# Patient Record
Sex: Female | Born: 1935 | Race: Black or African American | Hispanic: No | Smoking: Never smoker
Health system: Southern US, Community
[De-identification: ages and names within clinical notes are randomized; demographics above are authoritative.]

## PROBLEM LIST (undated history)

## (undated) DIAGNOSIS — N183 Chronic kidney disease, stage 3 unspecified: Secondary | ICD-10-CM

## (undated) DIAGNOSIS — E119 Type 2 diabetes mellitus without complications: Secondary | ICD-10-CM

## (undated) DIAGNOSIS — E1122 Type 2 diabetes mellitus with diabetic chronic kidney disease: Secondary | ICD-10-CM

## (undated) DIAGNOSIS — E7800 Pure hypercholesterolemia, unspecified: Secondary | ICD-10-CM

## (undated) DIAGNOSIS — I10 Essential (primary) hypertension: Secondary | ICD-10-CM

---

## 2003-12-28 ENCOUNTER — Ambulatory Visit: Payer: Self-pay | Admitting: Internal Medicine

## 2005-01-31 ENCOUNTER — Ambulatory Visit: Payer: Self-pay | Admitting: Internal Medicine

## 2005-03-09 ENCOUNTER — Ambulatory Visit: Payer: Self-pay | Admitting: Gastroenterology

## 2006-05-31 ENCOUNTER — Ambulatory Visit: Payer: Self-pay | Admitting: Internal Medicine

## 2007-06-03 ENCOUNTER — Ambulatory Visit: Payer: Self-pay | Admitting: Internal Medicine

## 2008-06-16 ENCOUNTER — Ambulatory Visit: Payer: Self-pay | Admitting: Internal Medicine

## 2008-08-13 ENCOUNTER — Ambulatory Visit: Payer: Self-pay | Admitting: Gastroenterology

## 2009-06-21 ENCOUNTER — Ambulatory Visit: Payer: Self-pay | Admitting: Internal Medicine

## 2013-11-16 DIAGNOSIS — E1122 Type 2 diabetes mellitus with diabetic chronic kidney disease: Secondary | ICD-10-CM | POA: Insufficient documentation

## 2013-11-16 DIAGNOSIS — M810 Age-related osteoporosis without current pathological fracture: Secondary | ICD-10-CM | POA: Insufficient documentation

## 2013-12-03 DIAGNOSIS — E7800 Pure hypercholesterolemia, unspecified: Secondary | ICD-10-CM | POA: Insufficient documentation

## 2014-07-05 ENCOUNTER — Emergency Department: Payer: Commercial Managed Care - HMO

## 2014-07-05 ENCOUNTER — Encounter: Payer: Self-pay | Admitting: Emergency Medicine

## 2014-07-05 ENCOUNTER — Emergency Department
Admission: EM | Admit: 2014-07-05 | Discharge: 2014-07-05 | Disposition: A | Discharge status: Home / Self Care | Payer: Commercial Managed Care - HMO | Attending: Emergency Medicine | Admitting: Emergency Medicine

## 2014-07-05 DIAGNOSIS — R52 Pain, unspecified: Secondary | ICD-10-CM

## 2014-07-05 DIAGNOSIS — E119 Type 2 diabetes mellitus without complications: Secondary | ICD-10-CM | POA: Diagnosis not present

## 2014-07-05 DIAGNOSIS — I10 Essential (primary) hypertension: Secondary | ICD-10-CM | POA: Insufficient documentation

## 2014-07-05 DIAGNOSIS — M79662 Pain in left lower leg: Secondary | ICD-10-CM | POA: Diagnosis not present

## 2014-07-05 HISTORY — DX: Chronic kidney disease, stage 3 (moderate): N18.3

## 2014-07-05 HISTORY — DX: Chronic kidney disease, stage 3 unspecified: N18.30

## 2014-07-05 HISTORY — DX: Pure hypercholesterolemia, unspecified: E78.00

## 2014-07-05 HISTORY — DX: Type 2 diabetes mellitus without complications: E11.9

## 2014-07-05 HISTORY — DX: Essential (primary) hypertension: I10

## 2014-07-05 HISTORY — DX: Type 2 diabetes mellitus with diabetic chronic kidney disease: E11.22

## 2014-07-05 MED ORDER — IBUPROFEN 400 MG PO TABS
400.0000 mg | ORAL_TABLET | Freq: Once | ORAL | Status: AC
  Administered 2014-07-05: 400 mg via ORAL

## 2014-07-05 MED ORDER — IBUPROFEN 400 MG PO TABS
ORAL_TABLET | ORAL | Status: AC
  Administered 2014-07-05: 400 mg via ORAL
  Filled 2014-07-05: qty 1

## 2014-07-05 NOTE — ED Provider Notes (Signed)
[facility] Emergency Department Provider Note    ____________________________________________  Time seen: 1040 I have reviewed the triage vital signs and the nursing notes.   HISTORY  Chief Complaint Leg Pain   History limited by: Not Limited   HPI Sonya Craig is a 81 y.o. female who presents to the emergency department because of left lower leg pain. She states the pain started 2 days ago and has gradually gotten worse. The pain is located in the lateral proximal aspect of her lower leg. She denies any trauma. She states she has been able to walk on it albeit with some pain. Denies any fevers or vomiting.     Past Medical History  Diagnosis Date  . Diabetes mellitus without complication   . Hypertension   . Hypercholesterolemia   . Stage 3 chronic kidney disease due to type 2 diabetes mellitus     There are no active problems to display for this patient.   History reviewed. No pertinent past surgical history.  No current outpatient prescriptions on file.  Allergies Review of patient's allergies indicates not on file.  History reviewed. No pertinent family history.  Social History History  Substance Use Topics  . Smoking status: Never Smoker   . Smokeless tobacco: Not on file  . Alcohol Use: Yes     Comment: occasionally    Review of Systems  Constitutional: Negative for fever. Cardiovascular: Negative for chest pain. Respiratory: Negative for shortness of breath. Gastrointestinal: Negative for abdominal pain, vomiting and diarrhea. Genitourinary: Negative for dysuria. Musculoskeletal: Negative for back pain. Left lower leg pain Skin: Negative for rash. Neurological: Negative for headaches, focal weakness or numbness.   10-point ROS otherwise negative.  ____________________________________________   PHYSICAL EXAM:  VITAL SIGNS: ED Triage Vitals  Enc Vitals Group     BP 07/05/14 1028 140/68 mmHg     Pulse Rate  07/05/14 1028 80     Resp --      Temp 07/05/14 1028 97.8 F (36.6 C)     Temp Source 07/05/14 1028 Oral     SpO2 07/05/14 1028 100 %     Weight 07/05/14 1028 147 lb (66.679 kg)     Height 07/05/14 1028 5\' 4"  (1.626 m)     Head Cir --      Peak Flow --      Pain Score 07/05/14 1031 8   Constitutional: Alert and oriented. Well appearing and in no distress. Eyes: Conjunctivae are normal. PERRL. Normal extraocular movements. ENT   Head: Normocephalic and atraumatic.   Nose: No congestion/rhinnorhea.   Mouth/Throat: Mucous membranes are moist.   Neck: No stridor. Hematological/Lymphatic/Immunilogical: No cervical lymphadenopathy. Cardiovascular: Normal rate, regular rhythm.  No murmurs, rubs, or gallops. Respiratory: Normal respiratory effort without tachypnea nor retractions. Breath sounds are clear and equal bilaterally. No wheezes/rales/rhonchi. Gastrointestinal: Soft and nontender. No distention.  Genitourinary: Deferred Musculoskeletal: Normal range of motion in all extremities. No joint effusions.  Mild tenderness to palpation of the left lateral lower leg. No obvious swelling. No discoloration, or ecchymosis. Neurovascularly intact distally. Neurologic:  Normal speech and language. No gross focal neurologic deficits are appreciated. Speech is normal.  Skin:  Skin is warm, dry and intact. No rash noted. Psychiatric: Mood and affect are normal. Speech and behavior are normal. Patient exhibits appropriate insight and judgment.  ____________________________________________    LABS (pertinent positives/negatives)  None  ____________________________________________   EKG  None  ____________________________________________    RADIOLOGY  Tib/fib x-ray IMPRESSION:  No acute abnormalities.  Knee degenerative changes.  ____________________________________________   PROCEDURES  Procedure(s) performed: None  Critical Care performed:  No  ____________________________________________   INITIAL IMPRESSION / ASSESSMENT AND PLAN / ED COURSE  Pertinent labs & imaging results that were available during my care of the patient were reviewed by me and considered in my medical decision making (see chart for details).  Patient here with the lateral left lower leg pain physical exam without any concerning findings. Will obtain x-rays to evaluate for osseous injury.  ----------------------------------------- 11:57 AM on 07/05/2014 -----------------------------------------  No acute findings found on x-ray. Patient  does state she feels better after medication. We will discharge home  ____________________________________________   FINAL CLINICAL IMPRESSION(S) / ED DIAGNOSES  Final diagnoses:  Pain     Nance Pear, MD 07/05/14 1158

## 2014-07-05 NOTE — Discharge Instructions (Signed)
Please seek medical attention for any high fevers, chest pain, shortness of breath, change in behavior, persistent vomiting, bloody stool or any other new or concerning symptoms. Arthritis, Nonspecific Arthritis is inflammation of a joint. This usually means pain, redness, warmth or swelling are present. One or more joints may be involved. There are a number of types of arthritis. Your caregiver may not be able to tell what type of arthritis you have right away. CAUSES  The most common cause of arthritis is the wear and tear on the joint (osteoarthritis). This causes damage to the cartilage, which can break down over time. The knees, hips, back and neck are most often affected by this type of arthritis. Other types of arthritis and common causes of joint pain include:  Sprains and other injuries near the joint. Sometimes minor sprains and injuries cause pain and swelling that develop hours later.  Rheumatoid arthritis. This affects hands, feet and knees. It usually affects both sides of your body at the same time. It is often associated with chronic ailments, fever, weight loss and general weakness.  Crystal arthritis. Gout and pseudo gout can cause occasional acute severe pain, redness and swelling in the foot, ankle, or knee.  Infectious arthritis. Bacteria can get into a joint through a break in overlying skin. This can cause infection of the joint. Bacteria and viruses can also spread through the blood and affect your joints.  Drug, infectious and allergy reactions. Sometimes joints can become mildly painful and slightly swollen with these types of illnesses. SYMPTOMS   Pain is the main symptom.  Your joint or joints can also be red, swollen and warm or hot to the touch.  You may have a fever with certain types of arthritis, or even feel overall ill.  The joint with arthritis will hurt with movement. Stiffness is present with some types of arthritis. DIAGNOSIS  Your caregiver will  suspect arthritis based on your description of your symptoms and on your exam. Testing may be needed to find the type of arthritis:  Blood and sometimes urine tests.  X-ray tests and sometimes CT or MRI scans.  Removal of fluid from the joint (arthrocentesis) is done to check for bacteria, crystals or other causes. Your caregiver (or a specialist) will numb the area over the joint with a local anesthetic, and use a needle to remove joint fluid for examination. This procedure is only minimally uncomfortable.  Even with these tests, your caregiver may not be able to tell what kind of arthritis you have. Consultation with a specialist (rheumatologist) may be helpful. TREATMENT  Your caregiver will discuss with you treatment specific to your type of arthritis. If the specific type cannot be determined, then the following general recommendations may apply. Treatment of severe joint pain includes:  Rest.  Elevation.  Anti-inflammatory medication (for example, ibuprofen) may be prescribed. Avoiding activities that cause increased pain.  Only take over-the-counter or prescription medicines for pain and discomfort as recommended by your caregiver.  Cold packs over an inflamed joint may be used for 10 to 15 minutes every hour. Hot packs sometimes feel better, but do not use overnight. Do not use hot packs if you are diabetic without your caregiver's permission.  A cortisone shot into arthritic joints may help reduce pain and swelling.  Any acute arthritis that gets worse over the next 1 to 2 days needs to be looked at to be sure there is no joint infection. Long-term arthritis treatment involves modifying activities and lifestyle  to reduce joint stress jarring. This can include weight loss. Also, exercise is needed to nourish the joint cartilage and remove waste. This helps keep the muscles around the joint strong. HOME CARE INSTRUCTIONS   Do not take aspirin to relieve pain if gout is suspected.  This elevates uric acid levels.  Only take over-the-counter or prescription medicines for pain, discomfort or fever as directed by your caregiver.  Rest the joint as much as possible.  If your joint is swollen, keep it elevated.  Use crutches if the painful joint is in your leg.  Drinking plenty of fluids may help for certain types of arthritis.  Follow your caregiver's dietary instructions.  Try low-impact exercise such as:  Swimming.  Water aerobics.  Biking.  Walking.  Morning stiffness is often relieved by a warm shower.  Put your joints through regular range-of-motion. SEEK MEDICAL CARE IF:   You do not feel better in 24 hours or are getting worse.  You have side effects to medications, or are not getting better with treatment. SEEK IMMEDIATE MEDICAL CARE IF:   You have a fever.  You develop severe joint pain, swelling or redness.  Many joints are involved and become painful and swollen.  There is severe back pain and/or leg weakness.  You have loss of bowel or bladder control. Document Released: 03/16/2004 Document Revised: 05/01/2011 Document Reviewed: 04/01/2008 [facility] Patient Information 2015 [place], [place]. This information is not intended to replace advice given to you by your health care provider. Make sure you discuss any questions you have with your health care provider.

## 2014-07-05 NOTE — ED Notes (Signed)
Patient arrival to [facility] ED with c/o left leg pain. Located lateral knee and donw mid shaft. Patient able to ambulate, with pain. Since friday

## 2018-04-21 ENCOUNTER — Emergency Department
Admission: EM | Admit: 2018-04-21 | Discharge: 2018-04-21 | Disposition: A | Discharge status: Home / Self Care | Payer: Medicare Other | Attending: Emergency Medicine | Admitting: Emergency Medicine

## 2018-04-21 ENCOUNTER — Other Ambulatory Visit: Payer: Self-pay

## 2018-04-21 DIAGNOSIS — I129 Hypertensive chronic kidney disease with stage 1 through stage 4 chronic kidney disease, or unspecified chronic kidney disease: Secondary | ICD-10-CM | POA: Diagnosis not present

## 2018-04-21 DIAGNOSIS — E1122 Type 2 diabetes mellitus with diabetic chronic kidney disease: Secondary | ICD-10-CM | POA: Diagnosis not present

## 2018-04-21 DIAGNOSIS — R55 Syncope and collapse: Secondary | ICD-10-CM | POA: Diagnosis not present

## 2018-04-21 DIAGNOSIS — N183 Chronic kidney disease, stage 3 (moderate): Secondary | ICD-10-CM | POA: Diagnosis not present

## 2018-04-21 DIAGNOSIS — R42 Dizziness and giddiness: Secondary | ICD-10-CM | POA: Insufficient documentation

## 2018-04-21 LAB — BASIC METABOLIC PANEL
Anion gap: 10 (ref 5–15)
BUN: 36 mg/dL — ABNORMAL HIGH (ref 8–23)
CALCIUM: 9.3 mg/dL (ref 8.9–10.3)
CO2: 29 mmol/L (ref 22–32)
CREATININE: 1.73 mg/dL — AB (ref 0.44–1.00)
Chloride: 102 mmol/L (ref 98–111)
GFR calc non Af Amer: 26 mL/min — ABNORMAL LOW (ref >60)
GFR, EST AFRICAN AMERICAN: 30 mL/min — AB (ref >60)
GLUCOSE: 149 mg/dL — AB (ref 70–99)
Potassium: 3.5 mmol/L (ref 3.5–5.1)
Sodium: 141 mmol/L (ref 135–145)

## 2018-04-21 LAB — CBC
HCT: 34 % — ABNORMAL LOW (ref 36.0–46.0)
Hemoglobin: 10.9 g/dL — ABNORMAL LOW (ref 12.0–15.0)
MCH: 30.3 pg (ref 26.0–34.0)
MCHC: 32.1 g/dL (ref 30.0–36.0)
MCV: 94.4 fL (ref 80.0–100.0)
NRBC: 0 % (ref 0.0–0.2)
PLATELETS: 171 10*3/uL (ref 150–400)
RBC: 3.6 MIL/uL — ABNORMAL LOW (ref 3.87–5.11)
RDW: 15.1 % (ref 11.5–15.5)
WBC: 4.9 10*3/uL (ref 4.0–10.5)

## 2018-04-21 LAB — URINALYSIS, COMPLETE (UACMP) WITH MICROSCOPIC
Bacteria, UA: NONE SEEN
Bilirubin Urine: NEGATIVE
Glucose, UA: NEGATIVE mg/dL
Hgb urine dipstick: NEGATIVE
Ketones, ur: NEGATIVE mg/dL
Nitrite: NEGATIVE
Protein, ur: NEGATIVE mg/dL
SPECIFIC GRAVITY, URINE: 1.013 (ref 1.005–1.030)
pH: 6 (ref 5.0–8.0)

## 2018-04-21 LAB — TROPONIN I

## 2018-04-21 LAB — GLUCOSE, CAPILLARY: Glucose-Capillary: 135 mg/dL — ABNORMAL HIGH (ref 70–99)

## 2018-04-21 MED ORDER — SODIUM CHLORIDE 0.9% FLUSH: 3.0000 mL | Freq: Once | INTRAVENOUS | Status: DC

## 2018-04-21 MED ORDER — SODIUM CHLORIDE 0.9 % IV BOLUS
1000.0000 mL | Freq: Once | INTRAVENOUS | Status: AC
  Administered 2018-04-21: 1000 mL via INTRAVENOUS

## 2018-04-21 NOTE — ED Provider Notes (Signed)
[facility] Emergency Department Provider Note  Time seen: 5:07 PM  I have reviewed the triage vital signs and the nursing notes.   HISTORY  Chief Complaint Loss of Consciousness    HPI Sonya Craig is a 85 y.o. female with a past medical history of diabetes, hypertension, hyperlipidemia, CKD, presents to the emergency department after a syncopal event.  According to the daughter patient was at a table when she was hungry getting ready to eat when she began feeling very lightheaded and said she felt like she was going to pass out.  Patient had a brief syncopal episode and then awoke.  Patient states she thinks is because her blood sugar dropped so she drink some orange juice and came to the emergency department.  Upon arrival patient's blood glucose is 135.  Patient denies any chest pain or shortness of breath.  Daughter states the patient was mildly diaphoretic prior to her syncopal episode.   Past Medical History:  Diagnosis Date  . Diabetes mellitus without complication ([place])   . Hypercholesterolemia   . Hypertension   . Stage 3 chronic kidney disease due to type 2 diabetes mellitus ([place])     There are no active problems to display for this patient.   History reviewed. No pertinent surgical history.  Prior to Admission medications   Not on File    No Known Allergies  No family history on file.  Social History Social History   Tobacco Use  . Smoking status: Never Smoker  . Smokeless tobacco: Never Used  Substance Use Topics  . Alcohol use: Yes    Comment: occasionally  . Drug use: Never    Review of Systems Constitutional: Negative for fever. Cardiovascular: Negative for chest pain. Respiratory: Negative for shortness of breath. Gastrointestinal: Negative for abdominal pain Musculoskeletal: Negative for musculoskeletal complaints Skin: Negative for skin complaints  Neurological: Negative for headache All other ROS  negative  ____________________________________________   PHYSICAL EXAM:  VITAL SIGNS: ED Triage Vitals  Enc Vitals Group     BP 04/21/18 1432 (!) 133/57     Pulse Rate 04/21/18 1432 75     Resp 04/21/18 1432 16     Temp 04/21/18 1432 98.2 F (36.8 C)     Temp Source 04/21/18 1432 Oral     SpO2 04/21/18 1432 100 %     Weight 04/21/18 1433 118 lb (53.5 kg)     Height 04/21/18 1433 5\' 4"  (1.626 m)     Head Circumference --      Peak Flow --      Pain Score 04/21/18 1432 0     Pain Loc --      Pain Edu? --      Excl. in [place]? --    Constitutional: Alert and oriented. Well appearing and in no distress. Eyes: Normal exam ENT   Head: Normocephalic and atraumatic.   Mouth/Throat: Mucous membranes are moist. Cardiovascular: Normal rate, regular rhythm.  Respiratory: Normal respiratory effort without tachypnea nor retractions. Breath sounds are clear Gastrointestinal: Soft and nontender. No distention.  Musculoskeletal: Nontender with normal range of motion in all extremities.  Neurologic:  Normal speech and language. No gross focal neurologic deficits Skin:  Skin is warm, dry and intact.  Psychiatric: Mood and affect are normal.   ____________________________________________    EKG  EKG viewed and interpreted by myself shows a normal sinus rhythm at 80 bpm with a narrow QRS, normal axis, normal intervals, no concerning ST changes.  ____________________________________________   INITIAL IMPRESSION / ASSESSMENT AND PLAN / ED COURSE  Pertinent labs & imaging results that were available during my care of the patient were reviewed by me and considered in my medical decision making (see chart for details).  Patient presents to the emergency department after a brief syncopal episode.  Differential would include orthostatic syncope, dehydration, arrhythmia, hypoglycemia, anemia.  We will check labs, IV hydrate and continue to closely monitor.  Patient's lab work shows mild  renal insufficiency with a creatinine of 1.73 largely unchanged from baseline creatinine per care everywhere in fact somewhat improved.  Anion gap is 10 consistent with possible mild dehydration we will IV hydrate.  Patient's hemoglobin is 10.9, no old hemoglobins for comparison although I would not expect syncope at this level and the patient denies any black or bloody stool.  Urinalysis is normal.  Patient states she has passed out in the past but has been quite some time.  Currently the patient appears very well has no complaints at this time.  I have added on a troponin to the patient's initial blood work and we will recheck a troponin.  If the patient's troponins are negative and the patient continues to appear well I believe the patient would be safe for discharge home with cardiology follow-up for a Holter monitor.  Patient's troponins are negative.  Patient continues to appear extremely well, has been IV hydrated.  Patient is comfortable going home with cardiology follow-up for consideration of Holter monitor.  I discussed very strict return precautions with the patient and family and they are agreeable.  ____________________________________________   FINAL CLINICAL IMPRESSION(S) / ED DIAGNOSES  Syncope    Harvest Dark, MD 04/21/18 1824

## 2018-04-21 NOTE — ED Notes (Signed)
MD paduchowski at bedside to update patient.

## 2018-04-21 NOTE — ED Triage Notes (Signed)
Pt is here with her daughter that she lives with, states she was sitting down getting ready to eat breakfast when she suddenly felt like she going to pass out, daughter states she did pass out for <62min and she eased her to the floor, she did not fall or get injured. Pt states she thinks her sugar dropped  States she did become diaphoretic.pt is a/ox4 on arrival stated she did drink orange juice since incident.

## 2018-04-21 NOTE — Discharge Instructions (Addendum)
Please call the number provided for cardiology tomorrow morning to arrange a follow-up appointment as soon as possible for evaluation and consideration of a Holter monitor given your recent syncopal event.  Please return to the emergency department for any lightheadedness, any feeling like you might pass out, any chest pain, or any other symptom personally concerning to yourself.

## 2018-05-20 ENCOUNTER — Telehealth: Payer: Self-pay | Admitting: *Deleted

## 2018-05-20 NOTE — Telephone Encounter (Signed)
Spoke with the patient and daughter. They would like to cancel the appointment with Dr. Fletcher Anon stating that thye did not feel it was needed. They did not know that they had the appointment.

## 2018-05-21 ENCOUNTER — Ambulatory Visit: Payer: Medicare Other | Admitting: Cardiovascular Disease

## 2018-09-30 DIAGNOSIS — I779 Disorder of arteries and arterioles, unspecified: Secondary | ICD-10-CM | POA: Insufficient documentation

## 2019-02-03 ENCOUNTER — Other Ambulatory Visit: Payer: Self-pay

## 2019-02-03 DIAGNOSIS — Z20822 Contact with and (suspected) exposure to covid-19: Secondary | ICD-10-CM

## 2019-02-04 LAB — NOVEL CORONAVIRUS, NAA: SARS-CoV-2, NAA: NOT DETECTED

## 2019-02-05 ENCOUNTER — Telehealth: Payer: Self-pay | Admitting: *Deleted

## 2019-02-05 NOTE — Telephone Encounter (Signed)
Patient given negative covid results . 

## 2019-08-25 ENCOUNTER — Encounter: Payer: Self-pay | Admitting: Emergency Medicine

## 2019-08-25 ENCOUNTER — Other Ambulatory Visit: Payer: Self-pay

## 2019-08-25 ENCOUNTER — Emergency Department: Payer: Medicare Other

## 2019-08-25 ENCOUNTER — Emergency Department
Admission: EM | Admit: 2019-08-25 | Discharge: 2019-08-25 | Disposition: A | Discharge status: Home / Self Care | Payer: Medicare Other | Attending: Emergency Medicine | Admitting: Emergency Medicine

## 2019-08-25 DIAGNOSIS — R42 Dizziness and giddiness: Secondary | ICD-10-CM | POA: Insufficient documentation

## 2019-08-25 DIAGNOSIS — E860 Dehydration: Secondary | ICD-10-CM

## 2019-08-25 DIAGNOSIS — E1122 Type 2 diabetes mellitus with diabetic chronic kidney disease: Secondary | ICD-10-CM | POA: Insufficient documentation

## 2019-08-25 DIAGNOSIS — N183 Chronic kidney disease, stage 3 unspecified: Secondary | ICD-10-CM | POA: Diagnosis not present

## 2019-08-25 DIAGNOSIS — Z79899 Other long term (current) drug therapy: Secondary | ICD-10-CM | POA: Insufficient documentation

## 2019-08-25 DIAGNOSIS — I129 Hypertensive chronic kidney disease with stage 1 through stage 4 chronic kidney disease, or unspecified chronic kidney disease: Secondary | ICD-10-CM | POA: Insufficient documentation

## 2019-08-25 DIAGNOSIS — Z7982 Long term (current) use of aspirin: Secondary | ICD-10-CM | POA: Insufficient documentation

## 2019-08-25 LAB — URINALYSIS, COMPLETE (UACMP) WITH MICROSCOPIC
Bacteria, UA: NONE SEEN
Bilirubin Urine: NEGATIVE
Glucose, UA: NEGATIVE mg/dL
Hgb urine dipstick: NEGATIVE
Ketones, ur: NEGATIVE mg/dL
Leukocytes,Ua: NEGATIVE
Nitrite: NEGATIVE
Protein, ur: NEGATIVE mg/dL
Specific Gravity, Urine: 1.005 (ref 1.005–1.030)
pH: 8 (ref 5.0–8.0)

## 2019-08-25 LAB — TROPONIN I (HIGH SENSITIVITY)
Troponin I (High Sensitivity): 19 ng/L — ABNORMAL HIGH (ref <18)
Troponin I (High Sensitivity): 17 ng/L (ref <18)

## 2019-08-25 LAB — CBC
HCT: 33.2 % — ABNORMAL LOW (ref 36.0–46.0)
Hemoglobin: 11 g/dL — ABNORMAL LOW (ref 12.0–15.0)
MCH: 30.1 pg (ref 26.0–34.0)
MCHC: 33.1 g/dL (ref 30.0–36.0)
MCV: 91 fL (ref 80.0–100.0)
Platelets: 205 10*3/uL (ref 150–400)
RBC: 3.65 MIL/uL — ABNORMAL LOW (ref 3.87–5.11)
RDW: 14 % (ref 11.5–15.5)
WBC: 3.9 10*3/uL — ABNORMAL LOW (ref 4.0–10.5)
nRBC: 0 % (ref 0.0–0.2)

## 2019-08-25 LAB — COMPREHENSIVE METABOLIC PANEL
ALT: 18 U/L (ref 0–44)
AST: 30 U/L (ref 15–41)
Albumin: 3.7 g/dL (ref 3.5–5.0)
Alkaline Phosphatase: 49 U/L (ref 38–126)
Anion gap: 10 (ref 5–15)
BUN: 43 mg/dL — ABNORMAL HIGH (ref 8–23)
CO2: 30 mmol/L (ref 22–32)
Calcium: 9.4 mg/dL (ref 8.9–10.3)
Chloride: 98 mmol/L (ref 98–111)
Creatinine, Ser: 1.64 mg/dL — ABNORMAL HIGH (ref 0.44–1.00)
GFR calc Af Amer: 32 mL/min — ABNORMAL LOW (ref >60)
GFR calc non Af Amer: 27 mL/min — ABNORMAL LOW (ref >60)
Glucose, Bld: 116 mg/dL — ABNORMAL HIGH (ref 70–99)
Potassium: 3.9 mmol/L (ref 3.5–5.1)
Sodium: 138 mmol/L (ref 135–145)
Total Bilirubin: 0.6 mg/dL (ref 0.3–1.2)
Total Protein: 6.9 g/dL (ref 6.5–8.1)

## 2019-08-25 MED ORDER — SODIUM CHLORIDE 0.9 % IV BOLUS
1000.0000 mL | Freq: Once | INTRAVENOUS | Status: AC
  Administered 2019-08-25: 1000 mL via INTRAVENOUS

## 2019-08-25 NOTE — ED Provider Notes (Signed)
[facility] Emergency Department Provider Note  Time seen: 6:40 PM  I have reviewed the triage vital signs and the nursing notes.   HISTORY  Chief Complaint Dizziness   HPI Sonya Craig is a 86 y.o. female with a past medical history of diabetes, hypertension, hyperlipidemia, CKD, presents to the emergency department for dizziness.  According to the patient report patient was feeling dizzy earlier especially upon standing.  Per EMS patient had near syncope upon standing.  Here patient appears well.  She has been in the waiting room for quite some time however during my evaluation patient denies any symptoms.  States she no longer feels dizzy.  Patient believes her symptoms originated from her not eating this morning.  Denies any chest pain at any point.  Denies any shortness of breath.  Denies any nausea or vomiting.  Denies abdominal pain.  Largely negative review of systems.   Past Medical History:  Diagnosis Date  . Diabetes mellitus without complication ([place])   . Hypercholesterolemia   . Hypertension   . Stage 3 chronic kidney disease due to type 2 diabetes mellitus ([place])     There are no problems to display for this patient.   History reviewed. No pertinent surgical history.  Prior to Admission medications   Medication Sig Start Date End Date Taking? Authorizing Provider  amitriptyline (ELAVIL) 10 MG tablet Take 10 mg by mouth at bedtime. 06/30/19   [provider]  aspirin 81 MG EC tablet Take 81 mg by mouth daily at 6 (six) AM.    [provider]  lisinopril-hydrochlorothiazide (ZESTORETIC) 20-12.5 MG tablet Take 1 tablet by mouth 2 (two) times daily. 07/28/19   [provider]  lovastatin (MEVACOR) 40 MG tablet Take 40 mg by mouth daily. 06/06/19   [provider]  mirtazapine (REMERON) 7.5 MG tablet Take 7.5 mg by mouth at bedtime. 06/06/19   [provider]    No Known Allergies  History reviewed. No  pertinent family history.  Social History Social History   Tobacco Use  . Smoking status: Never Smoker  . Smokeless tobacco: Never Used  Substance Use Topics  . Alcohol use: Yes    Comment: occasionally  . Drug use: Never    Review of Systems Constitutional: Negative for fever.  Positive for dizziness upon standing, now resolved Cardiovascular: Negative for chest pain. Respiratory: Negative for shortness of breath. Gastrointestinal: Negative for abdominal pain, vomiting Genitourinary: Negative for urinary compaints Musculoskeletal: Negative for musculoskeletal complaints Neurological: Negative for headache All other ROS negative  ____________________________________________   PHYSICAL EXAM:  VITAL SIGNS: ED Triage Vitals  Enc Vitals Group     BP 08/25/19 1408 (!) 153/62     Pulse Rate 08/25/19 1408 63     Resp 08/25/19 1408 16     Temp 08/25/19 1408 97.7 F (36.5 C)     Temp Source 08/25/19 1408 Oral     SpO2 08/25/19 1320 99 %     Weight 08/25/19 1409 110 lb (49.9 kg)     Height 08/25/19 1409 5\' 4"  (1.626 m)     Head Circumference --      Peak Flow --      Pain Score 08/25/19 1409 0     Pain Loc --      Pain Edu? --      Excl. in [place]? --     Constitutional: Alert and oriented. Well appearing and in no distress. Eyes: Normal exam ENT  Head: Normocephalic and atraumatic.      Mouth/Throat: Mucous membranes are moist. Cardiovascular: Normal rate, regular rhythm.  Respiratory: Normal respiratory effort without tachypnea nor retractions. Breath sounds are clear  Gastrointestinal: Soft and nontender. No distention. Musculoskeletal: Nontender with normal range of motion in all extremities.  Neurologic:  Normal speech and language. No gross focal neurologic deficits Skin:  Skin is warm, dry and intact.  Psychiatric: Mood and affect are normal.   ____________________________________________    EKG  EKG viewed and interpreted by myself shows a normal sinus  rhythm at 64 bpm with a narrow QRS, normal axis, normal intervals, no concerning ST changes.  ____________________________________________    RADIOLOGY  CT scan of the head is negative for acute abnormality  ____________________________________________   INITIAL IMPRESSION / ASSESSMENT AND PLAN / ED COURSE  Pertinent labs & imaging results that were available during my care of the patient were reviewed by me and considered in my medical decision making (see chart for details).   Patient presents to the emergency department for dizziness especially upon standing.  Currently the patient appears well, no distress.  Denies any symptoms.  No further dizziness.  Differential would include vertigo, near syncope, orthostatic hypotension, dehydration or hypovolemia, left-sided metabolic abnormality.  Patient's basic lab work is largely within normal limits, we will check a troponin, urinalysis we will IV hydrate obtain a CT scan of the head.  Patient agreeable to plan of care.  Patient's initial labs are largely within normal limits besides a very slight elevated troponin at 19.  We will repeat a troponin we will obtain a urine sample.  If the patient's repeat troponin and urine did not show any concerning findings I would anticipate likely discharge home.  Patient continues to feel well and denies any symptoms at this time.  Patient care signed out to oncoming provider.  Sonya Craig was evaluated in Emergency Department on 08/25/2019 for the symptoms described in the history of present illness. She was evaluated in the context of the global COVID-19 pandemic, which necessitated consideration that the patient might be at risk for infection with the SARS-CoV-2 virus that causes COVID-19. Institutional protocols and algorithms that pertain to the evaluation of patients at risk for COVID-19 are in a state of rapid change based on information released by regulatory bodies including the CDC and federal and  state organizations. These policies and algorithms were followed during the patient's care in the ED.  ____________________________________________   FINAL CLINICAL IMPRESSION(S) / ED DIAGNOSES  Dizziness   Harvest Dark, MD 08/25/19 2022

## 2019-08-25 NOTE — ED Triage Notes (Signed)
First Nurse Note:  Patient presents to the ED via EMS from home with dizziness and near syncope.  Per EMS, patient becomes diaphoretic and nauseous when standing.

## 2019-08-25 NOTE — ED Notes (Signed)
Patient transported to CT 

## 2019-08-25 NOTE — ED Provider Notes (Signed)
Vitals:   08/25/19 1841 08/25/19 2018  BP: (!) 166/79 (!) 178/83  Pulse: 61 (!) 52  Resp: 16 15  Temp:    SpO2: 100% 100%     ----------------------------------------- 9:47 PM on 08/25/2019 -----------------------------------------  Patient resting comfortably.  Discussed with her and she has been up she is walk to the bathroom with her cane and felt well without ongoing dizziness.  She is fully awake and alert.  Daughter will be taking her home.  She does endorse that she does not eat or drink much at all some days, and I suspect some element of dehydration as to potential causes for today's symptoms.  Discussed with patient.  Repeat troponin reassuring.  Urinalysis unrevealing of any infectious etiology.  Patient appears well feels improved after fluids.  Appears appropriate for discharge.  Return precautions and treatment recommendations and follow-up discussed with the patient who is agreeable with the plan.    Delman Kitten, MD 08/25/19 2148

## 2019-08-25 NOTE — Discharge Instructions (Signed)
You have been seen in the emergency department tonight for dizziness.  Your work-up has not shown any concerning findings.  Please drink plenty of fluids and obtain plenty of rest.  Return to the emergency department for any chest pain, weakness or numbness of any arm or leg confusion slurred speech or any other symptom personally concerning to yourself.

## 2019-08-25 NOTE — ED Triage Notes (Signed)
Here for dizziness since this AM.  Pt reports it is when she gets up and moves; describes as room spinning. No pain. No other symptoms per pt.  Unlabored. VSS

## 2020-04-17 ENCOUNTER — Encounter: Payer: Self-pay | Admitting: Internal Medicine

## 2020-04-17 ENCOUNTER — Inpatient Hospital Stay
Admission: EM | Admit: 2020-04-17 | Discharge: 2020-04-28 | DRG: 640 | Disposition: A | Discharge status: Skilled Nursing Facility | Payer: Medicare Other | Attending: Internal Medicine | Admitting: Internal Medicine

## 2020-04-17 ENCOUNTER — Other Ambulatory Visit: Payer: Self-pay

## 2020-04-17 ENCOUNTER — Emergency Department: Payer: Medicare Other

## 2020-04-17 DIAGNOSIS — R5381 Other malaise: Secondary | ICD-10-CM | POA: Diagnosis present

## 2020-04-17 DIAGNOSIS — Z66 Do not resuscitate: Secondary | ICD-10-CM | POA: Diagnosis present

## 2020-04-17 DIAGNOSIS — E785 Hyperlipidemia, unspecified: Secondary | ICD-10-CM | POA: Diagnosis present

## 2020-04-17 DIAGNOSIS — R410 Disorientation, unspecified: Secondary | ICD-10-CM

## 2020-04-17 DIAGNOSIS — E1122 Type 2 diabetes mellitus with diabetic chronic kidney disease: Secondary | ICD-10-CM | POA: Diagnosis present

## 2020-04-17 DIAGNOSIS — N184 Chronic kidney disease, stage 4 (severe): Secondary | ICD-10-CM | POA: Diagnosis present

## 2020-04-17 DIAGNOSIS — Z681 Body mass index (BMI) 19 or less, adult: Secondary | ICD-10-CM

## 2020-04-17 DIAGNOSIS — E7800 Pure hypercholesterolemia, unspecified: Secondary | ICD-10-CM | POA: Diagnosis present

## 2020-04-17 DIAGNOSIS — F05 Delirium due to known physiological condition: Secondary | ICD-10-CM | POA: Diagnosis not present

## 2020-04-17 DIAGNOSIS — Z7982 Long term (current) use of aspirin: Secondary | ICD-10-CM

## 2020-04-17 DIAGNOSIS — R4182 Altered mental status, unspecified: Secondary | ICD-10-CM

## 2020-04-17 DIAGNOSIS — R627 Adult failure to thrive: Secondary | ICD-10-CM | POA: Diagnosis present

## 2020-04-17 DIAGNOSIS — Z9189 Other specified personal risk factors, not elsewhere classified: Secondary | ICD-10-CM | POA: Diagnosis not present

## 2020-04-17 DIAGNOSIS — Z7984 Long term (current) use of oral hypoglycemic drugs: Secondary | ICD-10-CM

## 2020-04-17 DIAGNOSIS — I129 Hypertensive chronic kidney disease with stage 1 through stage 4 chronic kidney disease, or unspecified chronic kidney disease: Secondary | ICD-10-CM | POA: Diagnosis present

## 2020-04-17 DIAGNOSIS — D631 Anemia in chronic kidney disease: Secondary | ICD-10-CM | POA: Diagnosis present

## 2020-04-17 DIAGNOSIS — E46 Unspecified protein-calorie malnutrition: Secondary | ICD-10-CM | POA: Diagnosis present

## 2020-04-17 DIAGNOSIS — I10 Essential (primary) hypertension: Secondary | ICD-10-CM | POA: Diagnosis present

## 2020-04-17 DIAGNOSIS — Z20822 Contact with and (suspected) exposure to covid-19: Secondary | ICD-10-CM | POA: Diagnosis present

## 2020-04-17 DIAGNOSIS — E43 Unspecified severe protein-calorie malnutrition: Secondary | ICD-10-CM | POA: Diagnosis present

## 2020-04-17 DIAGNOSIS — E876 Hypokalemia: Secondary | ICD-10-CM | POA: Diagnosis not present

## 2020-04-17 DIAGNOSIS — N179 Acute kidney failure, unspecified: Secondary | ICD-10-CM | POA: Diagnosis not present

## 2020-04-17 DIAGNOSIS — R54 Age-related physical debility: Secondary | ICD-10-CM | POA: Diagnosis present

## 2020-04-17 DIAGNOSIS — R001 Bradycardia, unspecified: Secondary | ICD-10-CM | POA: Diagnosis present

## 2020-04-17 DIAGNOSIS — G8929 Other chronic pain: Secondary | ICD-10-CM | POA: Diagnosis present

## 2020-04-17 DIAGNOSIS — M25512 Pain in left shoulder: Secondary | ICD-10-CM | POA: Diagnosis present

## 2020-04-17 DIAGNOSIS — N1832 Chronic kidney disease, stage 3b: Secondary | ICD-10-CM

## 2020-04-17 DIAGNOSIS — F17220 Nicotine dependence, chewing tobacco, uncomplicated: Secondary | ICD-10-CM | POA: Diagnosis present

## 2020-04-17 DIAGNOSIS — M25511 Pain in right shoulder: Secondary | ICD-10-CM | POA: Diagnosis present

## 2020-04-17 DIAGNOSIS — R778 Other specified abnormalities of plasma proteins: Secondary | ICD-10-CM | POA: Diagnosis present

## 2020-04-17 DIAGNOSIS — Z515 Encounter for palliative care: Secondary | ICD-10-CM

## 2020-04-17 DIAGNOSIS — N183 Chronic kidney disease, stage 3 unspecified: Secondary | ICD-10-CM

## 2020-04-17 DIAGNOSIS — Z79899 Other long term (current) drug therapy: Secondary | ICD-10-CM

## 2020-04-17 LAB — URINALYSIS, COMPLETE (UACMP) WITH MICROSCOPIC
Bacteria, UA: NONE SEEN
Bilirubin Urine: NEGATIVE
Glucose, UA: NEGATIVE mg/dL
Hgb urine dipstick: NEGATIVE
Ketones, ur: NEGATIVE mg/dL
Leukocytes,Ua: NEGATIVE
Nitrite: NEGATIVE
Protein, ur: NEGATIVE mg/dL
Specific Gravity, Urine: 1.014 (ref 1.005–1.030)
pH: 5 (ref 5.0–8.0)

## 2020-04-17 LAB — CBC
HCT: 35.6 % — ABNORMAL LOW (ref 36.0–46.0)
Hemoglobin: 11.6 g/dL — ABNORMAL LOW (ref 12.0–15.0)
MCH: 29.7 pg (ref 26.0–34.0)
MCHC: 32.6 g/dL (ref 30.0–36.0)
MCV: 91.3 fL (ref 80.0–100.0)
Platelets: 269 10*3/uL (ref 150–400)
RBC: 3.9 MIL/uL (ref 3.87–5.11)
RDW: 17.4 % — ABNORMAL HIGH (ref 11.5–15.5)
WBC: 4.7 10*3/uL (ref 4.0–10.5)
nRBC: 0 % (ref 0.0–0.2)

## 2020-04-17 LAB — COMPREHENSIVE METABOLIC PANEL
ALT: 13 U/L (ref 0–44)
AST: 28 U/L (ref 15–41)
Albumin: 3.6 g/dL (ref 3.5–5.0)
Alkaline Phosphatase: 55 U/L (ref 38–126)
Anion gap: 7 (ref 5–15)
BUN: 40 mg/dL — ABNORMAL HIGH (ref 8–23)
CO2: 34 mmol/L — ABNORMAL HIGH (ref 22–32)
Calcium: 13.4 mg/dL (ref 8.9–10.3)
Chloride: 97 mmol/L — ABNORMAL LOW (ref 98–111)
Creatinine, Ser: 1.74 mg/dL — ABNORMAL HIGH (ref 0.44–1.00)
GFR, Estimated: 27 mL/min — ABNORMAL LOW (ref >60)
Glucose, Bld: 126 mg/dL — ABNORMAL HIGH (ref 70–99)
Potassium: 3.9 mmol/L (ref 3.5–5.1)
Sodium: 138 mmol/L (ref 135–145)
Total Bilirubin: 0.9 mg/dL (ref 0.3–1.2)
Total Protein: 7.2 g/dL (ref 6.5–8.1)

## 2020-04-17 LAB — AMMONIA: Ammonia: 11 umol/L (ref 9–35)

## 2020-04-17 LAB — TROPONIN I (HIGH SENSITIVITY): Troponin I (High Sensitivity): 36 ng/L — ABNORMAL HIGH (ref <18)

## 2020-04-17 LAB — RESP PANEL BY RT-PCR (FLU A&B, COVID) ARPGX2
Influenza A by PCR: NEGATIVE
Influenza B by PCR: NEGATIVE
SARS Coronavirus 2 by RT PCR: NEGATIVE

## 2020-04-17 LAB — TSH: TSH: 0.956 u[IU]/mL (ref 0.350–4.500)

## 2020-04-17 MED ORDER — LACTATED RINGERS IV SOLN: INTRAVENOUS | Status: AC

## 2020-04-17 MED ORDER — ACETAMINOPHEN 650 MG RE SUPP: 325.0000 mg | Freq: Four times a day (QID) | RECTAL | Status: AC | PRN

## 2020-04-17 MED ORDER — ENOXAPARIN SODIUM 30 MG/0.3ML ~~LOC~~ SOLN
30.0000 mg | SUBCUTANEOUS | Status: DC
  Administered 2020-04-17 – 2020-04-21 (×5): 30 mg via SUBCUTANEOUS
  Filled 2020-04-17 (×6): qty 0.3

## 2020-04-17 MED ORDER — LISINOPRIL 20 MG PO TABS
20.0000 mg | ORAL_TABLET | Freq: Two times a day (BID) | ORAL | Status: DC
  Administered 2020-04-18 – 2020-04-21 (×7): 20 mg via ORAL
  Filled 2020-04-17 (×7): qty 1

## 2020-04-17 MED ORDER — ACETAMINOPHEN 325 MG PO TABS: 325.0000 mg | ORAL_TABLET | Freq: Four times a day (QID) | ORAL | Status: AC | PRN

## 2020-04-17 MED ORDER — ASPIRIN EC 81 MG PO TBEC
81.0000 mg | DELAYED_RELEASE_TABLET | Freq: Every day | ORAL | Status: DC
  Administered 2020-04-18 – 2020-04-23 (×6): 81 mg via ORAL
  Filled 2020-04-17 (×6): qty 1

## 2020-04-17 MED ORDER — ONDANSETRON HCL 4 MG PO TABS: 4.0000 mg | ORAL_TABLET | Freq: Four times a day (QID) | ORAL | Status: DC | PRN

## 2020-04-17 MED ORDER — BOOST / RESOURCE BREEZE PO LIQD CUSTOM
1.0000 | Freq: Three times a day (TID) | ORAL | Status: DC
  Administered 2020-04-17 – 2020-04-19 (×6): 1 via ORAL

## 2020-04-17 MED ORDER — LISINOPRIL-HYDROCHLOROTHIAZIDE 20-12.5 MG PO TABS: 1.0000 | ORAL_TABLET | Freq: Two times a day (BID) | ORAL | Status: DC

## 2020-04-17 MED ORDER — PRAVASTATIN SODIUM 20 MG PO TABS
40.0000 mg | ORAL_TABLET | Freq: Every day | ORAL | Status: DC
  Administered 2020-04-18 – 2020-04-20 (×3): 40 mg via ORAL
  Filled 2020-04-17: qty 1
  Filled 2020-04-17 (×3): qty 2

## 2020-04-17 MED ORDER — MIRTAZAPINE 15 MG PO TABS
7.5000 mg | ORAL_TABLET | Freq: Every day | ORAL | Status: DC
  Administered 2020-04-17 – 2020-04-21 (×5): 7.5 mg via ORAL
  Filled 2020-04-17 (×5): qty 1

## 2020-04-17 MED ORDER — HYDROCHLOROTHIAZIDE 12.5 MG PO CAPS: 12.5000 mg | ORAL_CAPSULE | Freq: Two times a day (BID) | ORAL | Status: DC

## 2020-04-17 MED ORDER — ONDANSETRON HCL 4 MG/2ML IJ SOLN: 4.0000 mg | Freq: Four times a day (QID) | INTRAMUSCULAR | Status: DC | PRN

## 2020-04-17 NOTE — ED Notes (Signed)
Transportation requested  

## 2020-04-17 NOTE — ED Triage Notes (Signed)
Pt to ED via [facility] with c/o generalized weakness and AMS. Per EMS pt oriented to person only and able to name objects that are handed to her, per EMS pt disoreinted to time, place, situation. Per EMS pt's family reports that patient is normally walking/talking and independent.    130/70 74HR 92% RA CBG 157  Pt visualized in NAD at this time. Pt sitting in wheelchair alert on arrival to ED.

## 2020-04-17 NOTE — H&P (Addendum)
History and Physical   Sonya Craig [id number] DOB: 1928-02-15 DOA: 04/17/2020  PCP: Kirk Ruths, MD  Patient coming from: Home via EMS  I have personally briefly reviewed patient's old medical records in [facility] [place].  Chief Concern: Weakness and confusion  HPI: Sonya Craig is a 87 y.o. female with medical history significant for hypertension, chronic debility and weakness, CKD 4, non-insulin-dependent diabetes mellitus, presents to the emergency department for chief concerns of generalized weakness and confusion.  At bedside, patient was able to tell mer her full name, age of 87, identified daughter at bedside by her full name.    Daughter, Sonya Craig, whom patient lives with, states that since Thursday, 04/15/2020, patient was confused while in the restroom she could not remember how to get out, and then on Friday, 2/25, patient slept all day and had decreased p.o. intake.  Sonya Craig reports that patient only had half a boost in the last 2 days.  When asked about her heart stopping and whether or not patient wanted chest compression or if she cannot breathe on her own does she want to be intubated?  Patient states no and shook her head.  Patient states that if something were to happen, she wants to go on.  Social history: lives with daughter, Sonya Craig.  Patient chews tobacco daily.  Patient infrequently sips of etoh (rum).  Daughter denies patient history of recreational drug use.  She is retired and formerly worked in [employer].  Vaccination: fully COVID vaccination with booster at the health clinic.  ROS: Constitutional: no weight change, no fever ENT/Mouth: no sore throat, no rhinorrhea Eyes: no eye pain, no vision changes Cardiovascular: no chest pain, no dyspnea,  no edema, no palpitations Respiratory: no cough, no sputum, no wheezing Gastrointestinal: no nausea, no vomiting, no diarrhea, no constipation Genitourinary: no urinary incontinence, no dysuria,  no hematuria Musculoskeletal: no arthralgias, no myalgias Skin: no skin lesions, no pruritus, Neuro: + weakness, no loss of consciousness, no syncope Psych: no anxiety, no depression, + decrease appetite Heme/Lymph: no bruising, no bleeding  ED Course: Discussed with ED provider, patient requiring hospitalization due to hypercalcemia and generalized weakness and confusion.  Vitals in the emergency department was temperature of 97.8, respiration rate of 15-18, heart rate of 49-72, blood pressure 171/81, she was satting at 98% on room air.  EDP ordered a CT head without contrast and was read as no acute intracranial findings, similar mild parenchymal volume loss and sequela of chronic microvascular ischemic white matter disease.  Assessment/Plan  Principal Problem:   Hypercalcemia Active Problems:   Essential hypertension   CKD (chronic kidney disease), stage IV (HCC)   At risk for dehydration due to poor fluid intake   Protein malnutrition ([place])   Debility   Hypercalcemia-daughter at bedside denies seizure activity and or twitches -Suspect secondary to dehydration and poor p.o. intake -LR 125 mL/h, for 1 day -BMP in the a.m. -Ordered EKG  Weakness-TOC, PT, OT  CKD 4-at baseline, BMP in a.m.  Normocytic anemia-secondary to chronic kidney disease  Elevated troponin-we will continue to monitor, patient denies chest pain at this time  Hypertension-blood pressure appropriately elevated for age -Resumed home lisinopril-hydrochlorothiazide  Protein malnutrition-resumed home mirtazapine 7.5 mg nightly, dietary consult placed -Boost ordered  Given patient's age and frailty, I would recommend one family member to stay with patient in order to prevent acute delirium from unfamiliar environment.  Chart reviewed.   DVT prophylaxis: Enoxaparin 30 mg subcutaneous every 24 hours  Code Status: DNR Diet: Regular diet, boost ordered Family Communication: Updated daughter, Sonya Craig at  bedside Disposition Plan: Pending clinical course Consults called: None at this time Admission status: Observation to [place]  Past Medical History:  Diagnosis Date  . Diabetes mellitus without complication ([place])   . Hypercholesterolemia   . Hypertension   . Stage 3 chronic kidney disease due to type 2 diabetes mellitus ([place])    No past surgical history on file.  Social History:  reports that she has never smoked. She has never used smokeless tobacco. She reports current alcohol use. She reports that she does not use drugs.  No Known Allergies No family history on file. Family history: Family history reviewed and not pertinent  Prior to Admission medications   Medication Sig Start Date End Date Taking? Authorizing Provider  amitriptyline (ELAVIL) 10 MG tablet Take 10 mg by mouth at bedtime. 06/30/19   [provider]  aspirin 81 MG EC tablet Take 81 mg by mouth daily at 6 (six) AM.    [provider]  lisinopril-hydrochlorothiazide (ZESTORETIC) 20-12.5 MG tablet Take 1 tablet by mouth 2 (two) times daily. 07/28/19   [provider]  lovastatin (MEVACOR) 40 MG tablet Take 40 mg by mouth daily. 06/06/19   [provider]  mirtazapine (REMERON) 7.5 MG tablet Take 7.5 mg by mouth at bedtime. 06/06/19   [provider]   Physical Exam: Vitals:   04/17/20 1445 04/17/20 1500 04/17/20 1535 04/17/20 1600  BP:  (!) 171/81 (!) 179/78 (!) 160/85  Pulse: 62  (!) 58 (!) 56  Resp:  '15 16 16  '$ Temp:      TempSrc:      SpO2:  94% 98% 97%  Weight:      Height:       Constitutional: appears age-appropriate, frail, NAD, calm, comfortable Eyes: PERRL, lids and conjunctivae normal HENMT: Mucous membranes are moist. Posterior pharynx clear of any exudate or lesions. Age-appropriate dentition. Hearing appropriate.  Bilateral temporal wasting and orbital wasting. Neck: normal, supple, no masses, no thyromegaly Respiratory: clear to auscultation bilaterally,  no wheezing, no crackles. Normal respiratory effort. No accessory muscle use.  Cardiovascular: Regular rate and rhythm, no murmurs / rubs / gallops. No extremity edema. 2+ pedal pulses. No carotid bruits.  Abdomen: no tenderness, no masses palpated, no hepatosplenomegaly. Bowel sounds positive.  Musculoskeletal: no clubbing / cyanosis. No joint deformity upper and lower extremities. Good ROM, no contractures, no atrophy. Normal muscle tone.  Skin: no rashes, lesions, ulcers. No induration Neurologic: Sensation intact. Strength 5/5 in all 4.  Psychiatric: Normal judgment and insight. Alert and oriented x 3. Normal mood.   EKG: Ordered  Imaging on Admission: I personally reviewed and I agree with radiologist reading as below.  CT Head Wo Contrast  Result Date: 04/17/2020 CLINICAL DATA:  Altered mental status.  Generalized weakness. EXAM: CT HEAD WITHOUT CONTRAST TECHNIQUE: Contiguous axial images were obtained from the base of the skull through the vertex without intravenous contrast. COMPARISON:  Head CT August 25, 2019 FINDINGS: Brain: No evidence of acute large vascular territory infarction, hemorrhage, hydrocephalus, extra-axial collection or mass lesion/mass effect. Similar mild age related parenchymal atrophy and ex vacuo dilatation of ventricular system. Mild burden of chronic microvascular ischemic white matter change, unchanged from prior. Vascular: No hyperdense vessel. Atherosclerotic calcifications of the internal carotid and vertebral arteries. 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 Sinuses/Orbits: No acute  finding. Other: None. IMPRESSION: 1. No acute intracranial findings. 2. Similar mild parenchymal volume loss and sequela of chronic microvascular ischemic white matter disease. Electronically Signed   By: Dahlia Bailiff MD   On: 04/17/2020 15:48   Labs on Admission: I have personally reviewed following  labs  CBC: Recent Labs  Lab 04/17/20 1206  WBC 4.7  HGB 11.6*  HCT 35.6*  MCV 91.3  PLT [id number]   Basic Metabolic Panel: Recent Labs  Lab 04/17/20 1206  NA 138  K 3.9  CL 97*  CO2 34*  GLUCOSE 126*  BUN 40*  CREATININE 1.74*  CALCIUM 13.4*   GFR: Estimated Creatinine Clearance: 16.6 mL/min (A) (by C-G formula based on SCr of 1.74 mg/dL (H)).  Liver Function Tests: Recent Labs  Lab 04/17/20 1206  AST 28  ALT 13  ALKPHOS 55  BILITOT 0.9  PROT 7.2  ALBUMIN 3.6   Urine analysis:    Component Value Date/Time   COLORURINE YELLOW (A) 04/17/2020 1437   APPEARANCEUR HAZY (A) 04/17/2020 1437   LABSPEC 1.014 04/17/2020 1437   PHURINE 5.0 04/17/2020 1437   GLUCOSEU NEGATIVE 04/17/2020 1437   HGBUR NEGATIVE 04/17/2020 [place] 04/17/2020 [place] 04/17/2020 1437   PROTEINUR NEGATIVE 04/17/2020 1437   NITRITE NEGATIVE 04/17/2020 1437   LEUKOCYTESUR NEGATIVE 04/17/2020 1437   Elane Peabody N Irmgard Rampersaud D.O. [facility]  If 7PM-7AM, please contact overnight-coverage provider If 7AM-7PM, please contact day coverage provider [email or web address]  04/17/2020, 4:20 PM

## 2020-04-17 NOTE — ED Notes (Signed)
Pt states she feels weak- is oriented to self and situation only, follows commands well. Strength is equal in upper lower extremities. Pt is able to move all four extremities. Skin appears dry and is un-elastic. Daughter states she noticed pt was confused on Thursday and states she has not been eating or drinking sufficiently and is sleeping much more than normal. Daughter also reports decreased, cloudy orange urination. Denies cough, denies known exposure to infectious disease.

## 2020-04-17 NOTE — ED Notes (Signed)
Pt given water/juice. Pt educated on need for urine sample via urination/catheterization daughter/pt verbalize understanding. Pt drinking po fluids.

## 2020-04-18 DIAGNOSIS — E46 Unspecified protein-calorie malnutrition: Secondary | ICD-10-CM | POA: Diagnosis not present

## 2020-04-18 DIAGNOSIS — Z681 Body mass index (BMI) 19 or less, adult: Secondary | ICD-10-CM | POA: Diagnosis not present

## 2020-04-18 DIAGNOSIS — D631 Anemia in chronic kidney disease: Secondary | ICD-10-CM | POA: Diagnosis present

## 2020-04-18 DIAGNOSIS — R778 Other specified abnormalities of plasma proteins: Secondary | ICD-10-CM | POA: Diagnosis present

## 2020-04-18 DIAGNOSIS — R410 Disorientation, unspecified: Secondary | ICD-10-CM | POA: Diagnosis not present

## 2020-04-18 DIAGNOSIS — Z515 Encounter for palliative care: Secondary | ICD-10-CM | POA: Diagnosis not present

## 2020-04-18 DIAGNOSIS — R54 Age-related physical debility: Secondary | ICD-10-CM | POA: Diagnosis present

## 2020-04-18 DIAGNOSIS — E43 Unspecified severe protein-calorie malnutrition: Secondary | ICD-10-CM | POA: Diagnosis present

## 2020-04-18 DIAGNOSIS — I10 Essential (primary) hypertension: Secondary | ICD-10-CM | POA: Diagnosis not present

## 2020-04-18 DIAGNOSIS — Z66 Do not resuscitate: Secondary | ICD-10-CM | POA: Diagnosis present

## 2020-04-18 DIAGNOSIS — G8929 Other chronic pain: Secondary | ICD-10-CM | POA: Diagnosis present

## 2020-04-18 DIAGNOSIS — F17220 Nicotine dependence, chewing tobacco, uncomplicated: Secondary | ICD-10-CM | POA: Diagnosis present

## 2020-04-18 DIAGNOSIS — M25511 Pain in right shoulder: Secondary | ICD-10-CM | POA: Diagnosis present

## 2020-04-18 DIAGNOSIS — N179 Acute kidney failure, unspecified: Secondary | ICD-10-CM | POA: Diagnosis not present

## 2020-04-18 DIAGNOSIS — N184 Chronic kidney disease, stage 4 (severe): Secondary | ICD-10-CM | POA: Diagnosis present

## 2020-04-18 DIAGNOSIS — R001 Bradycardia, unspecified: Secondary | ICD-10-CM | POA: Diagnosis present

## 2020-04-18 DIAGNOSIS — M25512 Pain in left shoulder: Secondary | ICD-10-CM | POA: Diagnosis present

## 2020-04-18 DIAGNOSIS — R627 Adult failure to thrive: Secondary | ICD-10-CM | POA: Diagnosis present

## 2020-04-18 DIAGNOSIS — E876 Hypokalemia: Secondary | ICD-10-CM | POA: Diagnosis not present

## 2020-04-18 DIAGNOSIS — I129 Hypertensive chronic kidney disease with stage 1 through stage 4 chronic kidney disease, or unspecified chronic kidney disease: Secondary | ICD-10-CM | POA: Diagnosis present

## 2020-04-18 DIAGNOSIS — E785 Hyperlipidemia, unspecified: Secondary | ICD-10-CM | POA: Diagnosis present

## 2020-04-18 DIAGNOSIS — F05 Delirium due to known physiological condition: Secondary | ICD-10-CM | POA: Diagnosis not present

## 2020-04-18 DIAGNOSIS — R5381 Other malaise: Secondary | ICD-10-CM | POA: Diagnosis not present

## 2020-04-18 DIAGNOSIS — N1832 Chronic kidney disease, stage 3b: Secondary | ICD-10-CM | POA: Diagnosis not present

## 2020-04-18 DIAGNOSIS — Z7189 Other specified counseling: Secondary | ICD-10-CM | POA: Diagnosis not present

## 2020-04-18 DIAGNOSIS — Z20822 Contact with and (suspected) exposure to covid-19: Secondary | ICD-10-CM | POA: Diagnosis present

## 2020-04-18 DIAGNOSIS — E1122 Type 2 diabetes mellitus with diabetic chronic kidney disease: Secondary | ICD-10-CM | POA: Diagnosis present

## 2020-04-18 LAB — CBC
HCT: 35.4 % — ABNORMAL LOW (ref 36.0–46.0)
Hemoglobin: 11.7 g/dL — ABNORMAL LOW (ref 12.0–15.0)
MCH: 29.7 pg (ref 26.0–34.0)
MCHC: 33.1 g/dL (ref 30.0–36.0)
MCV: 89.8 fL (ref 80.0–100.0)
Platelets: 267 10*3/uL (ref 150–400)
RBC: 3.94 MIL/uL (ref 3.87–5.11)
RDW: 17 % — ABNORMAL HIGH (ref 11.5–15.5)
WBC: 4.3 10*3/uL (ref 4.0–10.5)
nRBC: 0 % (ref 0.0–0.2)

## 2020-04-18 LAB — BASIC METABOLIC PANEL
Anion gap: 9 (ref 5–15)
Anion gap: 8 (ref 5–15)
BUN: 38 mg/dL — ABNORMAL HIGH (ref 8–23)
BUN: 35 mg/dL — ABNORMAL HIGH (ref 8–23)
CO2: 30 mmol/L (ref 22–32)
CO2: 32 mmol/L (ref 22–32)
Calcium: 13 mg/dL — ABNORMAL HIGH (ref 8.9–10.3)
Calcium: 12.6 mg/dL — ABNORMAL HIGH (ref 8.9–10.3)
Chloride: 99 mmol/L (ref 98–111)
Chloride: 97 mmol/L — ABNORMAL LOW (ref 98–111)
Creatinine, Ser: 1.7 mg/dL — ABNORMAL HIGH (ref 0.44–1.00)
Creatinine, Ser: 1.62 mg/dL — ABNORMAL HIGH (ref 0.44–1.00)
GFR, Estimated: 28 mL/min — ABNORMAL LOW (ref >60)
GFR, Estimated: 30 mL/min — ABNORMAL LOW (ref >60)
Glucose, Bld: 138 mg/dL — ABNORMAL HIGH (ref 70–99)
Glucose, Bld: 121 mg/dL — ABNORMAL HIGH (ref 70–99)
Potassium: 3.9 mmol/L (ref 3.5–5.1)
Potassium: 3.3 mmol/L — ABNORMAL LOW (ref 3.5–5.1)
Sodium: 138 mmol/L (ref 135–145)
Sodium: 137 mmol/L (ref 135–145)

## 2020-04-18 MED ORDER — FUROSEMIDE 10 MG/ML IJ SOLN
40.0000 mg | Freq: Once | INTRAMUSCULAR | Status: AC
  Administered 2020-04-18: 40 mg via INTRAVENOUS
  Filled 2020-04-18: qty 4

## 2020-04-18 MED ORDER — DICLOFENAC SODIUM 1 % EX GEL
4.0000 g | Freq: Four times a day (QID) | CUTANEOUS | Status: DC
  Administered 2020-04-18 – 2020-04-23 (×20): 4 g via TOPICAL
  Filled 2020-04-18: qty 100

## 2020-04-18 MED ORDER — DRONABINOL 2.5 MG PO CAPS
2.5000 mg | ORAL_CAPSULE | Freq: Two times a day (BID) | ORAL | Status: DC
  Administered 2020-04-19 – 2020-04-20 (×4): 2.5 mg via ORAL
  Filled 2020-04-18 (×4): qty 1

## 2020-04-18 NOTE — Progress Notes (Signed)
OT Cancellation Note  Patient Details Name: Sonya Craig MRN: [id number] DOB: February 05, 1928   Cancelled Treatment:    Reason Eval/Treat Not Completed: Patient not medically ready. Orders received and chart reviewed - pt noted to have Ca critically high at 13.0; contraindicated for exertional activity at this time. Will continue to follow and initiate services as pt medically appropriate to participate in therapy.  Fredirick Maudlin, OTR/L [place]

## 2020-04-18 NOTE — Hospital Course (Addendum)
87 year old woman multiple medical problems presented with generalized weakness and confusion with poor oral intake.  Admitted for hypercalcemia, failure to thrive, generalized weakness.  Hypercalcemia corrected with fluids, calcitonin, discontinuation of hydrochlorothiazide.  Electrolytes repleted.  Acute kidney injury improved.  However general condition did not, after further discussion with the family with palliative medicine, family elected for comfort care.  Plan was for home with hospice 3/5 but daughter reported 3/5 that was unable to take home and  requested residential hospice. However residential hospice turned patient down 3/7. Now exploring home vs SNF.  Medically stable for discharge.  A & P  Hypercalcemia on admission resolved now with aggressive IV hydration and calcitonin. --Probably secondary to hydrochlorothiazide, corrected nicely with fluids and calcitonin.  On last check was continuing to trend down.   --continue comfort care, no further checks.  Hypomagnesemia, hypokalemia  --repleted  Severe protein calorie malnutrition with failure to thrive, generalized weakness and weight loss --Will continue comfort care  AKI on CKD IIIb --renal function stable on last check  Anemia of CKD --Hgb down probably from dilution, no further evaluation  Chronic bilateral shoulder pain --Diclofenac gel stopped per family request  Generalized weakness --continue supportive care.Marland Kitchen  Hospital delirium --Supportive care.  Avoid medications if possible as per family request.  Use family to help redirect patient.  Fall precautions.

## 2020-04-18 NOTE — Progress Notes (Signed)
PROGRESS NOTE    Sonya Craig   [id number]  DOB: 07/05/1927  PCP: Kirk Ruths, MD    DOA: 04/17/2020 LOS: 0   Brief Narrative   Summary of HPI on admission: 87 y.o. female with medical history significant for hypertension, chronic debility and weakness, CKD 4, non-insulin-dependent diabetes mellitus, presented to the ED on 04/18/20 for chief concerns of generalized weakness and confusion since 2/24.  Pt with very poor PO intake over this time.  ED Course: BP uncontrolled [id number] with systolic up to [id number].  Afebrile, mild bradycardia in 50's. Labs showed hypercalcemia 13.4, renal function at baseline CKD-4.  UA negative.  CT head without acute findings.  Hospital course:  Admitted to hospitalist service and started on aggressive IV hydration for hypercalcemia. PT and OT consulted for evaluation given profound weakness.     Assessment & Plan   Principal Problem:   Hypercalcemia Active Problems:   Essential hypertension   CKD (chronic kidney disease), stage IV (HCC)   At risk for dehydration due to poor fluid intake   Protein malnutrition (HCC)   Debility   Hypercalcemia -presented with calcium 13.4.  Started on aggressive IV hydration.  This morning calcium 13.0. --Continue aggressive IV hydration, on LR at 125/h --IV Lasix 40 mg once --Repeat labs this afternoon --Consider further Lasix along with the fluids --Thiazide discontinued, do not resume --Monitor on telemetry  Generalized weakness -multifactorial due to malnutrition, general debility, hypercalcemia. --PT and OT evaluations are pending, on hold due to critical calcium.  Severe malnutrition -weight down from 114 to 89 pounds, very poor p.o. intake at home recently.  Dietitian consulted.  Supplemental shakes.  Encourage patient to eat and drink.  On mirtazapine which should help with appetite.  Daughter requested additional appetite stimulant, we discussed dronabinol and she was in agreement.  Start  low-dose dronabinol twice daily before meals.  Monitor for side effects.  CKD stage IV -renal function near baseline.  Monitor BMP.  Anemia of chronic renal disease -appears stable.  Monitor CBC.  Essential hypertension -chronic, stable for age.  Continue on lisinopril.  Stop HCTZ and do not resume, can cause hypercalcemia.  Bilateral shoulder pain -chronic.  Uses liquid diclofenac solution topically at home.  Diclofenac gel ordered.  OT evaluation.  Patient BMI: Body mass index is 18.88 kg/m.   DVT prophylaxis: enoxaparin (LOVENOX) injection 30 mg Start: 04/17/20 2200 Place TED hose Start: 04/17/20 1618   Diet:  Diet Orders (From admission, onward)    Start     Ordered   04/17/20 1619  Diet regular Room service appropriate? Yes; Fluid consistency: Thin  Diet effective now       Question Answer Comment  Room service appropriate? Yes   Fluid consistency: Thin      04/17/20 1619            Code Status: DNR    Subjective 04/18/20    Patient seen with daughter at bedside today.  Patient reports feeling okay and denies pain.  Discussed recent history with daughter including weight loss down to 89 pounds from 114.  Very little appetite recently.  Worsening confusion as well.   Disposition Plan & Communication   Status is: Inpatient  Remains inpatient appropriate because:IV treatments appropriate due to intensity of illness or inability to take PO.  Patient has critically high calcium.   Dispo: The patient is from: Home              Anticipated d/c is  to: Home              Patient currently is not medically stable to d/c.   Difficult to place patient No  Family Communication: Daughter [name] at bedside on rounds today   Consults, Procedures, Significant Events   Consultants:   None  Procedures:   None  Antimicrobials:  Anti-infectives (From admission, onward)   None        Micro    Objective   Vitals:   04/17/20 2002 04/17/20 2358 04/18/20 0336  04/18/20 0755  BP: (!) 155/73 140/60 (!) 154/72 (!) 170/72  Pulse: 68 62 61 (!) 57  Resp: '16 15 15 18  '$ Temp: (!) 97.5 F (36.4 C) 98.1 F (36.7 C) 98.1 F (36.7 C) 98 F (36.7 C)  TempSrc:      SpO2: 100% 100% 98% 99%  Weight:      Height:        Intake/Output Summary (Last 24 hours) at 04/18/2020 0802 Last data filed at 04/18/2020 0443 Gross per 24 hour  Intake 1386.69 ml  Output --  Net 1386.69 ml   Filed Weights   04/17/20 1202  Weight: 49.9 kg    Physical Exam:  General exam: awake, alert, no acute distress, frail, cachectic HEENT: Dry mucus membranes, hearing grossly normal  Respiratory system: CTAB, no wheezes, rales or rhonchi, normal respiratory effort, on room air. Cardiovascular system: normal S1/S2, RRR, no pedal edema.   Gastrointestinal system: Sunken abdomen, soft, NT, ND, hypoactive bowel sounds. Central nervous system: no gross focal neurologic deficits, normal speech Extremities: no edema, normal tone  Labs   Data Reviewed: I have personally reviewed following labs and imaging studies  CBC: Recent Labs  Lab 04/17/20 1206 04/18/20 0512  WBC 4.7 4.3  HGB 11.6* 11.7*  HCT 35.6* 35.4*  MCV 91.3 89.8  PLT 269 [id number]   Basic Metabolic Panel: Recent Labs  Lab 04/17/20 1206 04/18/20 0512  NA 138 138  K 3.9 3.9  CL 97* 99  CO2 34* 30  GLUCOSE 126* 138*  BUN 40* 38*  CREATININE 1.74* 1.70*  CALCIUM 13.4* 13.0*   GFR: Estimated Creatinine Clearance: 17 mL/min (A) (by C-G formula based on SCr of 1.7 mg/dL (H)). Liver Function Tests: Recent Labs  Lab 04/17/20 1206  AST 28  ALT 13  ALKPHOS 55  BILITOT 0.9  PROT 7.2  ALBUMIN 3.6   No results for input(s): LIPASE, AMYLASE in the last 168 hours. Recent Labs  Lab 04/17/20 1625  AMMONIA 11   Coagulation Profile: No results for input(s): INR, PROTIME in the last 168 hours. Cardiac Enzymes: No results for input(s): CKTOTAL, CKMB, CKMBINDEX, TROPONINI in the last 168 hours. BNP (last 3  results) No results for input(s): PROBNP in the last 8760 hours. HbA1C: No results for input(s): HGBA1C in the last 72 hours. CBG: No results for input(s): GLUCAP in the last 168 hours. Lipid Profile: No results for input(s): CHOL, HDL, LDLCALC, TRIG, CHOLHDL, LDLDIRECT in the last 72 hours. Thyroid Function Tests: Recent Labs    04/17/20 1625  TSH 0.956   Anemia Panel: No results for input(s): VITAMINB12, FOLATE, FERRITIN, TIBC, IRON, RETICCTPCT in the last 72 hours. Sepsis Labs: No results for input(s): PROCALCITON, LATICACIDVEN in the last 168 hours.  Recent Results (from the past 240 hour(s))  Resp Panel by RT-PCR (Flu A&B, Covid) Nasopharyngeal Swab     Status: None   Collection Time: 04/17/20  2:53 PM   Specimen: Nasopharyngeal Swab; Nasopharyngeal(NP) swabs  in vial transport medium  Result Value Ref Range Status   SARS Coronavirus 2 by RT PCR NEGATIVE NEGATIVE Final    Comment: (NOTE) SARS-CoV-2 target nucleic acids are NOT DETECTED.  The SARS-CoV-2 RNA is generally detectable in upper respiratory specimens during the acute phase of infection. The lowest concentration of SARS-CoV-2 viral copies this assay can detect is 138 copies/mL. A negative result does not preclude SARS-Cov-2 infection and should not be used as the sole basis for treatment or other patient management decisions. A negative result may occur with  improper specimen collection/handling, submission of specimen other than nasopharyngeal swab, presence of viral mutation(s) within the areas targeted by this assay, and inadequate number of viral copies(<138 copies/mL). A negative result must be combined with clinical observations, patient history, and epidemiological information. The expected result is Negative.  Fact Sheet for Patients:  EntrepreneurPulse.com.au  Fact Sheet for Healthcare Providers:  IncredibleEmployment.be  This test is no t yet approved or cleared  by the [place] FDA and  has been authorized for detection and/or diagnosis of SARS-CoV-2 by FDA under an Emergency Use Authorization (EUA). This EUA will remain  in effect (meaning this test can be used) for the duration of the COVID-19 declaration under Section 564(b)(1) of the Act, 21 U.S.C.section 360bbb-3(b)(1), unless the authorization is terminated  or revoked sooner.       Influenza A by PCR NEGATIVE NEGATIVE Final   Influenza B by PCR NEGATIVE NEGATIVE Final    Comment: (NOTE) The Xpert Xpress SARS-CoV-2/FLU/RSV plus assay is intended as an aid in the diagnosis of influenza from Nasopharyngeal swab specimens and should not be used as a sole basis for treatment. Nasal washings and aspirates are unacceptable for Xpert Xpress SARS-CoV-2/FLU/RSV testing.  Fact Sheet for Patients: EntrepreneurPulse.com.au  Fact Sheet for Healthcare Providers: IncredibleEmployment.be  This test is not yet approved or cleared by the [place] FDA and has been authorized for detection and/or diagnosis of SARS-CoV-2 by FDA under an Emergency Use Authorization (EUA). This EUA will remain in effect (meaning this test can be used) for the duration of the COVID-19 declaration under Section 564(b)(1) of the Act, 21 U.S.C. section 360bbb-3(b)(1), unless the authorization is terminated or revoked.  Performed at [facility], [address] [place], [place] [address]       Imaging Studies   CT Head Wo Contrast  Result Date: 04/17/2020 CLINICAL DATA:  Altered mental status.  Generalized weakness. EXAM: CT HEAD WITHOUT CONTRAST TECHNIQUE: Contiguous axial images were obtained from the base of the skull through the vertex without intravenous contrast. COMPARISON:  Head CT August 25, 2019 FINDINGS: Brain: No evidence of acute large vascular territory infarction, hemorrhage, hydrocephalus, extra-axial collection or mass lesion/mass effect. Similar mild  age related parenchymal atrophy and ex vacuo dilatation of ventricular system. Mild burden of chronic microvascular ischemic white matter change, unchanged from prior. Vascular: No hyperdense vessel. Atherosclerotic calcifications of the internal carotid and vertebral arteries. 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 Sinuses/Orbits: No acute finding. Other: None. IMPRESSION: 1. No acute intracranial findings. 2. Similar mild parenchymal volume loss and sequela of chronic microvascular ischemic white matter disease. Electronically Signed   By: Dahlia Bailiff MD   On: 04/17/2020 15:48     Medications   Scheduled Meds: . aspirin EC  81 mg Oral Q0600  . enoxaparin (LOVENOX) injection  30 mg Subcutaneous Q24H  . feeding supplement  1 Container Oral TID BM  . lisinopril  20 mg Oral BID   And  . hydrochlorothiazide  12.5 mg Oral BID  . mirtazapine  7.5 mg Oral QHS  . pravastatin  40 mg Oral q1800   Continuous Infusions: . lactated ringers 125 mL/hr at 04/18/20 0443       LOS: 0 days    Time spent: 30 minutes with greater than 50% spent at bedside in coordination of care.    Ezekiel Slocumb, DO [facility]  04/18/2020, 8:02 AM      If 7PM-7AM, please contact night-coverage. How to contact the [facility] Attending or Consulting provider [place] or covering provider during after hours [place], for this patient?    1. Check the care team in [facility] and look for a) attending/consulting [facility] provider listed and b) the [facility] team listed 2. Log into [email or web address] and use [place]'s universal password to access. If you do not have the password, please contact the hospital operator. 3. Locate the [facility] provider you are looking for under [facility] and page to a number that you can be directly reached. 4. If you still have difficulty reaching the provider, please page the [facility] (Director on Call) for the  Hospitalists listed on amion for assistance.

## 2020-04-18 NOTE — Progress Notes (Signed)
PT Cancellation Note  Patient Details Name: Sonya Craig MRN: [id number] DOB: 25-Oct-1927   Cancelled Treatment:    Reason Eval/Treat Not Completed: Medical issues which prohibited therapy. Orders received and chart reviewed - pt noted to have Ca critically high at 13.0; contraindicated for exertional activity at this time. Will continue to follow and initiate services as pt medically stable to participate in physical therapy interventions.   Isaias Cowman 04/18/2020, 9:38 AM

## 2020-04-18 NOTE — Plan of Care (Addendum)
Moments of confusion noted, but pt calm and cooperative. BP and labs being monitored closely. BP meds being adjusted accordingly and Lasix administered. Pt on IVF. Adequate urine output noted. Daughter at bedside throughout the day and attentive to her care. Voltagen cream used for bil knee and shoulder chronic pain. Safety measures in place. Will continue to monitor. Problem: Education: Goal: Knowledge of General Education information will improve Description: Including pain rating scale, medication(s)/side effects and non-pharmacologic comfort measures Outcome: Progressing   Problem: Clinical Measurements: Goal: Ability to maintain clinical measurements within normal limits will improve Outcome: Progressing Goal: Will remain free from infection Outcome: Progressing Goal: Diagnostic test results will improve Outcome: Progressing Goal: Respiratory complications will improve Outcome: Progressing Goal: Cardiovascular complication will be avoided Outcome: Progressing   Problem: Activity: Goal: Risk for activity intolerance will decrease Outcome: Progressing   Problem: Nutrition: Goal: Adequate nutrition will be maintained Outcome: Progressing   Problem: Coping: Goal: Level of anxiety will decrease Outcome: Progressing   Problem: Elimination: Goal: Will not experience complications related to bowel motility Outcome: Progressing Goal: Will not experience complications related to urinary retention Outcome: Progressing   Problem: Pain Managment: Goal: General experience of comfort will improve Outcome: Progressing   Problem: Safety: Goal: Ability to remain free from injury will improve Outcome: Progressing   Problem: Skin Integrity: Goal: Risk for impaired skin integrity will decrease Outcome: Progressing

## 2020-04-19 DIAGNOSIS — I10 Essential (primary) hypertension: Secondary | ICD-10-CM | POA: Diagnosis not present

## 2020-04-19 DIAGNOSIS — E43 Unspecified severe protein-calorie malnutrition: Secondary | ICD-10-CM | POA: Diagnosis not present

## 2020-04-19 DIAGNOSIS — R5381 Other malaise: Secondary | ICD-10-CM | POA: Diagnosis not present

## 2020-04-19 LAB — MAGNESIUM: Magnesium: 1.4 mg/dL — ABNORMAL LOW (ref 1.7–2.4)

## 2020-04-19 LAB — BASIC METABOLIC PANEL
Anion gap: 8 (ref 5–15)
BUN: 33 mg/dL — ABNORMAL HIGH (ref 8–23)
CO2: 34 mmol/L — ABNORMAL HIGH (ref 22–32)
Calcium: 12.6 mg/dL — ABNORMAL HIGH (ref 8.9–10.3)
Chloride: 97 mmol/L — ABNORMAL LOW (ref 98–111)
Creatinine, Ser: 1.64 mg/dL — ABNORMAL HIGH (ref 0.44–1.00)
GFR, Estimated: 29 mL/min — ABNORMAL LOW (ref >60)
Glucose, Bld: 85 mg/dL (ref 70–99)
Potassium: 3.1 mmol/L — ABNORMAL LOW (ref 3.5–5.1)
Sodium: 139 mmol/L (ref 135–145)

## 2020-04-19 LAB — CALCIUM: Calcium: 12.9 mg/dL — ABNORMAL HIGH (ref 8.9–10.3)

## 2020-04-19 MED ORDER — ADULT MULTIVITAMIN W/MINERALS CH
1.0000 | ORAL_TABLET | Freq: Every day | ORAL | Status: DC
  Administered 2020-04-20 – 2020-04-21 (×2): 1 via ORAL
  Filled 2020-04-19 (×2): qty 1

## 2020-04-19 MED ORDER — CALCITONIN (SALMON) 200 UNIT/ML IJ SOLN
4.0000 [IU]/kg | Freq: Two times a day (BID) | INTRAMUSCULAR | Status: DC
  Administered 2020-04-19 – 2020-04-20 (×3): 200 [IU] via SUBCUTANEOUS
  Filled 2020-04-19 (×4): qty 1

## 2020-04-19 MED ORDER — AMLODIPINE BESYLATE 5 MG PO TABS: 5.0000 mg | ORAL_TABLET | Freq: Every day | ORAL | Status: DC

## 2020-04-19 MED ORDER — SODIUM CHLORIDE 0.9 % IV SOLN: INTRAVENOUS | Status: DC

## 2020-04-19 MED ORDER — MAGNESIUM SULFATE IN D5W 1-5 GM/100ML-% IV SOLN
1.0000 g | Freq: Once | INTRAVENOUS | Status: AC
  Administered 2020-04-19: 1 g via INTRAVENOUS
  Filled 2020-04-19: qty 100

## 2020-04-19 MED ORDER — POTASSIUM CHLORIDE 2 MEQ/ML IV SOLN
INTRAVENOUS | Status: DC
  Filled 2020-04-19 (×2): qty 1000

## 2020-04-19 MED ORDER — ENSURE ENLIVE PO LIQD
237.0000 mL | Freq: Two times a day (BID) | ORAL | Status: DC
  Administered 2020-04-19 – 2020-04-22 (×4): 237 mL via ORAL

## 2020-04-19 MED ORDER — FUROSEMIDE 10 MG/ML IJ SOLN: 40.0000 mg | Freq: Once | INTRAMUSCULAR | Status: DC

## 2020-04-19 MED ORDER — POTASSIUM CHLORIDE 20 MEQ PO PACK
20.0000 meq | PACK | Freq: Two times a day (BID) | ORAL | Status: AC
  Administered 2020-04-19 – 2020-04-20 (×4): 20 meq via ORAL
  Filled 2020-04-19 (×4): qty 1

## 2020-04-19 NOTE — Progress Notes (Addendum)
Initial Nutrition Assessment  DOCUMENTATION CODES:   Severe malnutrition in context of chronic illness,Underweight  INTERVENTION:  Provide Ensure Enlive po BID, each supplement provides 350 kcal and 20 grams of protein.  Provide Magic cup TID with meals, each supplement provides 290 kcal and 9 grams of protein.  Provide MVI po daily.  Reviewed "High-Calorie, High-Protein Nutrition Therapy" and "High-Calorie, High-Protein Recipes" handouts from the Academy of [employer] and Dietetics with patient's daughter. Reviewed strategies for increasing calorie and protein intake. Encouraged intake of small, frequent meals throughout the day.  Monitor magnesium, potassium, and phosphorus daily for at least 3 days, MD to replete as needed, as pt is at risk for refeeding syndrome.  NUTRITION DIAGNOSIS:   Severe Malnutrition related to chronic illness (CKD, advanced age) as evidenced by severe fat depletion,severe muscle depletion.  GOAL:   Patient will meet greater than or equal to 90% of their needs  MONITOR:   PO intake,Supplement acceptance,Labs,Weight trends,I & O's  REASON FOR ASSESSMENT:   Consult Assessment of nutrition requirement/status  ASSESSMENT:   87 year old female with PMHx of DM, HTN, CKD stage IV admitted with hypercalcemia, generalized weakness, severe malnutrition.   Attempted to meet with patient at bedside. She was sleeping. Briefly woke up but fell back asleep quickly and was unable to provide any history. Met with patient's daughter who works on unit. She reports patient has had a poor appetite for a while now. She has been eating less at meals and was even hiding food she couldn't finish eating. She eats at least one meal per day. She really enjoys soup and occasionally will having a sandwich but is eating small amounts now. She drinks chocolate Boost or any flavor Ensure except butter pecan. Patient has dentures and they are in room. Daughter denies any difficulty with  chewing or swallowing. Daughter reports patient has been eating better here in the hospital than at home. On the evening she arrived she had meat loaf, macaroni and cheese, and brownie and finished most of the meal.  Patient lost weight slowly over time from around 114 lbs to 89 lbs. RD obtained bed scale weight of 42.6 kg (93.92 lbs).   Medications reviewed and include: Marinol 2.5 mg BID before lunch and dinner, lisinopril, Remeron 7.5 mg daily, NS at 125 mL/hr.  Labs reviewed: Potassium 3.1, Chloride 97, CO2 34, Creatinine 1.64, Magnesium 1.4.  NUTRITION - FOCUSED PHYSICAL EXAM:  Flowsheet Row Most Recent Value  Orbital Region Severe depletion  Upper Arm Region Severe depletion  Thoracic and Lumbar Region Severe depletion  Buccal Region Severe depletion  Temple Region Severe depletion  Clavicle Bone Region Severe depletion  Clavicle and Acromion Bone Region Severe depletion  Scapular Bone Region Severe depletion  Dorsal Hand Severe depletion  Patellar Region Severe depletion  Anterior Thigh Region Severe depletion  Posterior Calf Region Severe depletion  Edema (RD Assessment) None  Hair Reviewed  Eyes Reviewed  Mouth Reviewed  Skin Reviewed  Nails Reviewed     Diet Order:   Diet Order            Diet regular Room service appropriate? Yes; Fluid consistency: Thin  Diet effective now                EDUCATION NEEDS:   Education needs have been addressed  Skin:  Skin Assessment: Reviewed RN Assessment  Last BM:  04/18/2020 - medium type 4  Height:   Ht Readings from Last 1 Encounters:  04/17/20 _0  (  1.626 m)   Weight:   Wt Readings from Last 1 Encounters:  04/19/20 42.6 kg   BMI:  Body mass index is 16.12 kg/m.  Estimated Nutritional Needs:   Kcal:  1200-1400  Protein:  60-70 grams  Fluid:  1.2-1.4 L/day  Jacklynn Barnacle, MS, RD, LDN Pager number available on Amion

## 2020-04-19 NOTE — Evaluation (Signed)
Physical Therapy Evaluation Patient Details Name: Sonya Craig MRN: [id number] DOB: 01-23-1928 Today's Date: 04/19/2020   History of Present Illness  Pt is a 87 y.o. female with medical history significant for hypertension, chronic debility and weakness, CKD 4, non-insulin-dependent diabetes mellitus, presented to the ED on 04/18/20 for chief concerns of generalized weakness and confusion since 2/24.  MD assessment includes: hypercalcemia, protein malnutrition, and debility.    Clinical Impression  Pt was pleasant and motivated to participate during the session.  Pt required mod A to manage her LE's during sup to sit but was able to manage her trunk to full upright position without assistance.  Pt was able to stand and ambulate 12 feet without physical assistance and although her steps were very short with a slow cadence the pt was steady without LOB and no adverse symptoms noted.  Pt will benefit from HHPT services and 24/7 supervision upon discharge to safely address deficits listed in patient problem list for decreased caregiver assistance and eventual return to PLOF.      Follow Up Recommendations Home health PT;Supervision/Assistance - 24 hour    Equipment Recommendations  Rolling walker with 5" wheels;3in1 (PT)    Recommendations for Other Services       Precautions / Restrictions Precautions Precautions: Fall Restrictions Weight Bearing Restrictions: No      Mobility  Bed Mobility Overal bed mobility: Needs Assistance Bed Mobility: Supine to Sit     Supine to sit: Mod assist;HOB elevated     General bed mobility comments: Mod A for BLE control with pt able to manage trunk up to full sitting without assist    Transfers Overall transfer level: Needs assistance Equipment used: Rolling walker (2 wheeled) Transfers: Sit to/from Stand Sit to Stand: Min guard         General transfer comment: Fair concentric and eccentric control and  stability  Ambulation/Gait Ambulation/Gait assistance: Min guard Gait Distance (Feet): 12 Feet Assistive device: Rolling walker (2 wheeled) Gait Pattern/deviations: Step-through pattern;Decreased step length - right;Decreased step length - left Gait velocity: decreased   General Gait Details: Very slow cadence with short B step length but steady without LOB or adverse symptoms  Stairs            Wheelchair Mobility    Modified Rankin (Stroke Patients Only)       Balance Overall balance assessment: Needs assistance Sitting-balance support: Feet supported;Bilateral upper extremity supported Sitting balance-Leahy Scale: Fair     Standing balance support: During functional activity;Bilateral upper extremity supported Standing balance-Leahy Scale: Fair Standing balance comment: Mod lean on the RW for support                             Pertinent Vitals/Pain Pain Assessment: No/denies pain    Home Living Family/patient expects to be discharged to:: Private residence Living Arrangements: Children Available Help at Discharge: Family;Available 24 hours/day Type of Home: House Home Access: Stairs to enter Entrance Stairs-Rails: None Entrance Stairs-Number of Steps: 3 Home Layout: One level Home Equipment: Walker - 4 wheels;Shower seat Additional Comments: History from pt's daughter; family availble for 24/7 assistance    Prior Function Level of Independence: Needs assistance   Gait / Transfers Assistance Needed: Mod Ind amb in the home with a rollator  ADL's / Homemaking Assistance Needed: Some assist from family with ADLs        Hand Dominance   Dominant Hand: Right    Extremity/Trunk  Assessment        Lower Extremity Assessment Lower Extremity Assessment: Generalized weakness       Communication   Communication: No difficulties  Cognition Arousal/Alertness: Awake/alert Behavior During Therapy: WFL for tasks assessed/performed Overall  Cognitive Status: Within Functional Limits for tasks assessed                                 General Comments: Increased time to follow commands      General Comments      Exercises Total Joint Exercises Ankle Circles/Pumps: AROM;Strengthening;Both;10 reps Quad Sets: Strengthening;Both;10 reps Heel Slides: AROM;AAROM;Strengthening;Both;10 reps Hip ABduction/ADduction: AROM;AAROM;Strengthening;Both;10 reps Straight Leg Raises: AROM;AAROM;Strengthening;Both;10 reps Long Arc Quad: AROM;Strengthening;Both;10 reps Knee Flexion: AROM;Strengthening;Both;10 reps Marching in Standing: AROM;Strengthening;Both;5 reps;Standing   Assessment/Plan    PT Assessment Patient needs continued PT services  PT Problem List Decreased strength;Decreased activity tolerance;Decreased balance;Decreased mobility;Decreased knowledge of use of DME       PT Treatment Interventions DME instruction;Gait training;Stair training;Functional mobility training;Therapeutic activities;Therapeutic exercise;Balance training;Patient/family education    PT Goals (Current goals can be found in the Care Plan section)  Acute Rehab PT Goals Patient Stated Goal: To return home PT Goal Formulation: With patient Time For Goal Achievement: 05/02/20 Potential to Achieve Goals: Good    Frequency Min 2X/week   Barriers to discharge        Co-evaluation               AM-PAC PT "6 Clicks" Mobility  Outcome Measure Help needed turning from your back to your side while in a flat bed without using bedrails?: A Little Help needed moving from lying on your back to sitting on the side of a flat bed without using bedrails?: A Lot Help needed moving to and from a bed to a chair (including a wheelchair)?: A Lot Help needed standing up from a chair using your arms (e.g., wheelchair or bedside chair)?: A Little Help needed to walk in hospital room?: A Little Help needed climbing 3-5 steps with a railing? : A  Lot 6 Click Score: 15    End of Session Equipment Utilized During Treatment: Gait belt Activity Tolerance: Patient tolerated treatment well Patient left: in chair;with call bell/phone within reach;with chair alarm set Nurse Communication: Mobility status PT Visit Diagnosis: Difficulty in walking, not elsewhere classified (R26.2);Muscle weakness (generalized) (M62.81)    Time: [id number] PT Time Calculation (min) (ACUTE ONLY): 34 min   Charges:   PT Evaluation $PT Eval Moderate Complexity: 1 Mod PT Treatments $Therapeutic Exercise: 8-22 mins        D. Royetta Asal PT, DPT 04/19/20, 3:32 PM

## 2020-04-19 NOTE — Progress Notes (Addendum)
PROGRESS NOTE    Sonya Craig   [id number]  DOB: 01-08-1928  PCP: Kirk Ruths, MD    DOA: 04/17/2020 LOS: 1   Brief Narrative   Summary of HPI on admission: 87 y.o. female with medical history significant for hypertension, chronic debility and weakness, type 2 diabetes with CKD stage 4, presented to the ED on 04/18/20 for chief concerns of generalized weakness and confusion since 2/24.  Pt with very poor PO intake over this time.  ED Course: BP uncontrolled [id number] with systolic up to [id number].  Afebrile, mild bradycardia in 50's. Labs showed hypercalcemia 13.4, renal function at baseline CKD-4.  UA negative.  CT head without acute findings.  Hospital course:  Admitted to hospitalist service and started on aggressive IV hydration for hypercalcemia. PT and OT consulted for evaluation given profound weakness.     Assessment & Plan   Principal Problem:   Hypercalcemia Active Problems:   Essential hypertension   CKD (chronic kidney disease), stage IV (HCC)   At risk for dehydration due to poor fluid intake   Protein malnutrition (HCC)   Debility   Protein-calorie malnutrition, severe   Hypercalcemia -presented with calcium 13.4.  Started on aggressive IV hydration.   Ca trend: 13.4>>13.0>>12.6 --Continue aggressive IV hydration --Calcitonin subcutaneous BID x 4 days --Repeat Ca level this afternoon --Consider Lasix if edema or other s/sx's volume overload --Thiazide discontinued, do not resume --Monitor on telemetry  Hypomagnesemia / Hypokalemia - Mg and K being replaced and monitored.  Further replacement as needed.  Monitor labs daily.  Generalized weakness -multifactorial due to malnutrition, general debility, hypercalcemia. --PT and OT evaluations are pending, on hold due to critical calcium.  Severe Protein Calorie Malnutrition -weight down from 114 to 89 pounds, very poor p.o. intake at home recently.  Dietitian consulted, appreciate recommendation.   Supplemental shakes.  Encourage patient to eat and drink.  On mirtazapine which should help with appetite.   Started trial low-dose dronabinol after d/w daughter. Monitor for side effects.  CKD stage IV -renal function near baseline.  Monitor BMP.  Anemia of chronic renal disease -appears stable.  Monitor CBC.  Essential hypertension -chronic, stable for age.  Continue on lisinopril.  Stop HCTZ and do not resume, can cause hypercalcemia.  Add amlodipine.  Bilateral shoulder pain -chronic.  Uses liquid diclofenac solution topically at home.  Diclofenac gel ordered.  OT evaluation.  Patient BMI: Body mass index is 16.12 kg/m.   DVT prophylaxis: enoxaparin (LOVENOX) injection 30 mg Start: 04/17/20 2200 Place TED hose Start: 04/17/20 1618   Diet:  Diet Orders (From admission, onward)     Start     Ordered   04/17/20 1619  Diet regular Room service appropriate? Yes; Fluid consistency: Thin  Diet effective now       Question Answer Comment  Room service appropriate? Yes   Fluid consistency: Thin      04/17/20 1619              Code Status: DNR    Subjective 04/19/20    Patient seen with daughter Kieth Brightly at bedside today.  Pt sleeping, woke easily, denies any complaints, just tired.  Daughter reports patient still confused, did not recognize her at first and usually does at baseline.  No acute events reported.   Disposition Plan & Communication   Status is: Inpatient  Remains inpatient appropriate because:IV treatments appropriate due to intensity of illness or inability to take PO.     Dispo: The  patient is from: Home              Anticipated d/c is to: Home              Patient currently is not medically stable to d/c.   Difficult to place patient No  Family Communication: Daughter [name] at bedside on rounds today   Consults, Procedures, Significant Events   Consultants:  None  Procedures:  None  Antimicrobials:  Anti-infectives (From admission, onward)     None         Micro    Objective   Vitals:   04/19/20 0957 04/19/20 1154 04/19/20 1410 04/19/20 1624  BP: (!) 168/76 (!) 141/69  (!) 149/91  Pulse:  61  65  Resp:  18  18  Temp:  98 F (36.7 C)  98.1 F (36.7 C)  TempSrc:      SpO2:  98%  99%  Weight:   42.6 kg   Height:        Intake/Output Summary (Last 24 hours) at 04/19/2020 1945 Last data filed at 04/19/2020 1909 Gross per 24 hour  Intake 520.32 ml  Output --  Net 520.32 ml   Filed Weights   04/17/20 1202 04/19/20 1410  Weight: 49.9 kg 42.6 kg    Physical Exam:  General exam: sleeping, wakes to voice, no acute distress, frail, cachectic Respiratory system: normal respiratory effort, on room air. Cardiovascular system: RRR, no pedal edema.   Gastrointestinal system: Sunken abdomen, soft, NT, ND, hypoactive bowel sounds. Extremities: no edema, normal tone  Labs   Data Reviewed: I have personally reviewed following labs and imaging studies  CBC: Recent Labs  Lab 04/17/20 1206 04/18/20 0512  WBC 4.7 4.3  HGB 11.6* 11.7*  HCT 35.6* 35.4*  MCV 91.3 89.8  PLT 269 [id number]   Basic Metabolic Panel: Recent Labs  Lab 04/17/20 1206 04/18/20 0512 04/18/20 1703 04/19/20 0413 04/19/20 1553  NA 138 138 137 139  --   K 3.9 3.9 3.3* 3.1*  --   CL 97* 99 97* 97*  --   CO2 34* 30 32 34*  --   GLUCOSE 126* 138* 121* 85  --   BUN 40* 38* 35* 33*  --   CREATININE 1.74* 1.70* 1.62* 1.64*  --   CALCIUM 13.4* 13.0* 12.6* 12.6* 12.9*  MG  --   --   --  1.4*  --    GFR: Estimated Creatinine Clearance: 15 mL/min (A) (by C-G formula based on SCr of 1.64 mg/dL (H)). Liver Function Tests: Recent Labs  Lab 04/17/20 1206  AST 28  ALT 13  ALKPHOS 55  BILITOT 0.9  PROT 7.2  ALBUMIN 3.6   No results for input(s): LIPASE, AMYLASE in the last 168 hours. Recent Labs  Lab 04/17/20 1625  AMMONIA 11   Coagulation Profile: No results for input(s): INR, PROTIME in the last 168 hours. Cardiac Enzymes: No results  for input(s): CKTOTAL, CKMB, CKMBINDEX, TROPONINI in the last 168 hours. BNP (last 3 results) No results for input(s): PROBNP in the last 8760 hours. HbA1C: No results for input(s): HGBA1C in the last 72 hours. CBG: No results for input(s): GLUCAP in the last 168 hours. Lipid Profile: No results for input(s): CHOL, HDL, LDLCALC, TRIG, CHOLHDL, LDLDIRECT in the last 72 hours. Thyroid Function Tests: Recent Labs    04/17/20 1625  TSH 0.956   Anemia Panel: No results for input(s): VITAMINB12, FOLATE, FERRITIN, TIBC, IRON, RETICCTPCT in the last 72 hours.  Sepsis Labs: No results for input(s): PROCALCITON, LATICACIDVEN in the last 168 hours.  Recent Results (from the past 240 hour(s))  Resp Panel by RT-PCR (Flu A&B, Covid) Nasopharyngeal Swab     Status: None   Collection Time: 04/17/20  2:53 PM   Specimen: Nasopharyngeal Swab; Nasopharyngeal(NP) swabs in vial transport medium  Result Value Ref Range Status   SARS Coronavirus 2 by RT PCR NEGATIVE NEGATIVE Final    Comment: (NOTE) SARS-CoV-2 target nucleic acids are NOT DETECTED.  The SARS-CoV-2 RNA is generally detectable in upper respiratory specimens during the acute phase of infection. The lowest concentration of SARS-CoV-2 viral copies this assay can detect is 138 copies/mL. A negative result does not preclude SARS-Cov-2 infection and should not be used as the sole basis for treatment or other patient management decisions. A negative result may occur with  improper specimen collection/handling, submission of specimen other than nasopharyngeal swab, presence of viral mutation(s) within the areas targeted by this assay, and inadequate number of viral copies(<138 copies/mL). A negative result must be combined with clinical observations, patient history, and epidemiological information. The expected result is Negative.  Fact Sheet for Patients:  EntrepreneurPulse.com.au  Fact Sheet for Healthcare Providers:   IncredibleEmployment.be  This test is no t yet approved or cleared by the [place] FDA and  has been authorized for detection and/or diagnosis of SARS-CoV-2 by FDA under an Emergency Use Authorization (EUA). This EUA will remain  in effect (meaning this test can be used) for the duration of the COVID-19 declaration under Section 564(b)(1) of the Act, 21 U.S.C.section 360bbb-3(b)(1), unless the authorization is terminated  or revoked sooner.       Influenza A by PCR NEGATIVE NEGATIVE Final   Influenza B by PCR NEGATIVE NEGATIVE Final    Comment: (NOTE) The Xpert Xpress SARS-CoV-2/FLU/RSV plus assay is intended as an aid in the diagnosis of influenza from Nasopharyngeal swab specimens and should not be used as a sole basis for treatment. Nasal washings and aspirates are unacceptable for Xpert Xpress SARS-CoV-2/FLU/RSV testing.  Fact Sheet for Patients: EntrepreneurPulse.com.au  Fact Sheet for Healthcare Providers: IncredibleEmployment.be  This test is not yet approved or cleared by the [place] FDA and has been authorized for detection and/or diagnosis of SARS-CoV-2 by FDA under an Emergency Use Authorization (EUA). This EUA will remain in effect (meaning this test can be used) for the duration of the COVID-19 declaration under Section 564(b)(1) of the Act, 21 U.S.C. section 360bbb-3(b)(1), unless the authorization is terminated or revoked.  Performed at [facility], [address] [place], [place] [address]       Imaging Studies   No results found.   Medications   Scheduled Meds:  [START ON 04/20/2020] amLODipine  5 mg Oral Daily   aspirin EC  81 mg Oral Q0600   calcitonin  4 Units/kg Subcutaneous BID   diclofenac Sodium  4 g Topical QID   dronabinol  2.5 mg Oral BID AC   enoxaparin (LOVENOX) injection  30 mg Subcutaneous Q24H   feeding supplement  237 mL Oral BID BM   lisinopril  20 mg  Oral BID   mirtazapine  7.5 mg Oral QHS   [START ON 04/20/2020] multivitamin with minerals  1 tablet Oral Daily   potassium chloride  20 mEq Oral BID   pravastatin  40 mg Oral q1800   Continuous Infusions:  sodium chloride 125 mL/hr at 04/19/20 1517       LOS: 1 day  Time spent: 30 minutes with greater than 50% spent at bedside in coordination of care.    Ezekiel Slocumb, DO [facility]  04/19/2020, 7:45 PM      If 7PM-7AM, please contact night-coverage. How to contact the [facility] Attending or Consulting provider [place] or covering provider during after hours [place], for this patient?    Check the care team in [facility] and look for a) attending/consulting [facility] provider listed and b) the [facility] team listed Log into [email or web address] and use [place]'s universal password to access. If you do not have the password, please contact the hospital operator. Locate the [facility] provider you are looking for under [facility] and page to a number that you can be directly reached. If you still have difficulty reaching the provider, please page the [facility] (Director on Call) for the Hospitalists listed on amion for assistance.

## 2020-04-19 NOTE — Evaluation (Signed)
Occupational Therapy Evaluation Patient Details Name: Sonya Craig MRN: [id number] DOB: 1927-06-23 Today's Date: 04/19/2020    History of Present Illness 87 y.o. female with medical history significant for hypertension, chronic debility and weakness, CKD 4, non-insulin-dependent diabetes mellitus, presented to the ED on 04/18/20 for chief concerns of generalized weakness and confusion since 2/24.  Pt with very poor PO intake over this time.   Clinical Impression   Patient presenting with decreased I in self care, balance, functional mobility/transfers, endurance, and safety awareness. OT able to speak to pt's 3 daughter this session. Pt lives with daughter and utilizes rollator at baseline. Pt is able to wash and dress self but does need assistance from family to get into shower secondary to added fall risk. Caregivers noticed her getting weaker over several days and having increased confusion. Patient currently functioning at min - mod A for self care tasks and functional mobility. Pt with posterior bias in standing this session. Family plans on having 24/7 assist for patient at discharge.  Patient will benefit from acute OT to increase overall independence in the areas of ADLs, functional mobility, and safety awareness in order to safely discharge home with family.    Follow Up Recommendations  Home health OT;Supervision/Assistance - 24 hour    Equipment Recommendations  3 in 1 bedside commode;Other (comment) (RW, reacher)       Precautions / Restrictions Precautions Precautions: Fall      Mobility Bed Mobility Overal bed mobility: Needs Assistance Bed Mobility: Supine to Sit;Sit to Supine     Supine to sit: Max assist;HOB elevated Sit to supine: Max assist;HOB elevated   General bed mobility comments: increased time to follow commands and min cuing for hand placement and technique    Transfers Overall transfer level: Needs assistance Equipment used: 1 person hand held  assist Transfers: Sit to/from Stand Sit to Stand: Min assist;Mod assist         General transfer comment: Pt with posterior bias in standing.    Balance Overall balance assessment: Needs assistance Sitting-balance support: Feet supported Sitting balance-Leahy Scale: Fair     Standing balance support: During functional activity Standing balance-Leahy Scale: Poor Standing balance comment: posterior bias noted                           ADL either performed or assessed with clinical judgement   ADL Overall ADL's : Needs assistance/impaired     Grooming: Wash/dry hands;Wash/dry face;Oral care;Sitting;Supervision/safety;Set up           Upper Body Dressing : Minimal assistance;Sitting Upper Body Dressing Details (indicate cue type and reason): to don hospital gown                 Functional mobility during ADLs: Minimal assistance;Moderate assistance;Cueing for safety       Vision Patient Visual Report: No change from baseline              Pertinent Vitals/Pain Pain Assessment: No/denies pain     Hand Dominance Right   Extremity/Trunk Assessment Upper Extremity Assessment Upper Extremity Assessment: Generalized weakness   Lower Extremity Assessment Lower Extremity Assessment: Generalized weakness   Cervical / Trunk Assessment Cervical / Trunk Assessment: Kyphotic   Communication Communication Communication: No difficulties   Cognition Arousal/Alertness: Awake/alert Behavior During Therapy: WFL for tasks assessed/performed Overall Cognitive Status: Within Functional Limits for tasks assessed  General Comments: Pt was A & O x4 this session. Family reports she is much clearer cognitively today than yesterday. Pt very pleasant. She does need increased time to follow commands.              Home Living Family/patient expects to be discharged to:: Private residence Living Arrangements:  Children Available Help at Discharge: Family;Available 24 hours/day Type of Home: House Home Access: Stairs to enter [employer] of Steps: 3 STE Entrance Stairs-Rails: None Home Layout: One level     Bathroom Shower/Tub: Occupational psychologist: Standard Bathroom Accessibility: Yes How Accessible: Accessible via walker Home Equipment: Agar - 4 wheels;Shower seat          Prior Functioning/Environment Level of Independence: Independent with assistive device(s)        Comments: Family reports pt is mod I with use of rollator at home. She sink baths herself with daughter assisting with shower once a week. Family reports they will provide 24/7 assistance at home.        OT Problem List: Decreased strength;Impaired balance (sitting and/or standing);Decreased safety awareness;Decreased knowledge of use of DME or AE;Decreased activity tolerance      OT Treatment/Interventions: Self-care/ADL training;DME and/or AE instruction;Therapeutic activities;Balance training;Therapeutic exercise;Manual therapy;Energy conservation;Patient/family education    OT Goals(Current goals can be found in the care plan section) Acute Rehab OT Goals Patient Stated Goal: to go home OT Goal Formulation: With patient/family Time For Goal Achievement: 05/03/20 Potential to Achieve Goals: Good  OT Frequency: Min 2X/week   Barriers to D/C: Other (comment)  none known at this time          AM-PAC OT "6 Clicks" Daily Activity     Outcome Measure Help from another person eating meals?: A Little Help from another person taking care of personal grooming?: A Little Help from another person toileting, which includes using toliet, bedpan, or urinal?: A Lot Help from another person bathing (including washing, rinsing, drying)?: A Lot Help from another person to put on and taking off regular upper body clothing?: A Little Help from another person to put on and taking off regular lower  body clothing?: A Lot 6 Click Score: 15   End of Session Nurse Communication: Mobility status  Activity Tolerance: Patient tolerated treatment well Patient left: in bed;with call bell/phone within reach;with bed alarm set;with nursing/sitter in room  OT Visit Diagnosis: Unsteadiness on feet (R26.81);History of falling (Z91.81);Muscle weakness (generalized) (M62.81)                Time: [id number] OT Time Calculation (min): 27 min Charges:  OT General Charges $OT Visit: 1 Visit OT Evaluation $OT Eval Moderate Complexity: 1 Mod OT Treatments $Self Care/Home Management : 8-22 mins  Darleen Crocker, MS, OTR/L , CBIS ascom [phone]  04/19/20, 1:18 PM

## 2020-04-20 ENCOUNTER — Inpatient Hospital Stay: Payer: Medicare Other

## 2020-04-20 DIAGNOSIS — R5381 Other malaise: Secondary | ICD-10-CM | POA: Diagnosis not present

## 2020-04-20 DIAGNOSIS — I10 Essential (primary) hypertension: Secondary | ICD-10-CM | POA: Diagnosis not present

## 2020-04-20 DIAGNOSIS — E43 Unspecified severe protein-calorie malnutrition: Secondary | ICD-10-CM | POA: Diagnosis not present

## 2020-04-20 LAB — PTH, INTACT AND CALCIUM
Calcium, Total (PTH): 13.7 mg/dL (ref 8.7–10.3)
PTH: 16 pg/mL (ref 15–65)

## 2020-04-20 LAB — BASIC METABOLIC PANEL
Anion gap: 7 (ref 5–15)
BUN: 26 mg/dL — ABNORMAL HIGH (ref 8–23)
CO2: 31 mmol/L (ref 22–32)
Calcium: 11.7 mg/dL — ABNORMAL HIGH (ref 8.9–10.3)
Chloride: 101 mmol/L (ref 98–111)
Creatinine, Ser: 1.41 mg/dL — ABNORMAL HIGH (ref 0.44–1.00)
GFR, Estimated: 35 mL/min — ABNORMAL LOW (ref >60)
Glucose, Bld: 117 mg/dL — ABNORMAL HIGH (ref 70–99)
Potassium: 3.4 mmol/L — ABNORMAL LOW (ref 3.5–5.1)
Sodium: 139 mmol/L (ref 135–145)

## 2020-04-20 LAB — PHOSPHORUS: Phosphorus: 2.1 mg/dL — ABNORMAL LOW (ref 2.5–4.6)

## 2020-04-20 LAB — CALCIUM: Calcium: 10.8 mg/dL — ABNORMAL HIGH (ref 8.9–10.3)

## 2020-04-20 LAB — MAGNESIUM: Magnesium: 1.7 mg/dL (ref 1.7–2.4)

## 2020-04-20 MED ORDER — AMLODIPINE BESYLATE 10 MG PO TABS
10.0000 mg | ORAL_TABLET | Freq: Every day | ORAL | Status: DC
  Administered 2020-04-20 – 2020-04-22 (×2): 10 mg via ORAL
  Filled 2020-04-20 (×4): qty 1

## 2020-04-20 MED ORDER — MAGNESIUM SULFATE IN D5W 1-5 GM/100ML-% IV SOLN
1.0000 g | Freq: Once | INTRAVENOUS | Status: AC
  Administered 2020-04-20: 1 g via INTRAVENOUS
  Filled 2020-04-20: qty 100

## 2020-04-20 MED ORDER — K PHOS MONO-SOD PHOS DI & MONO 155-852-130 MG PO TABS
500.0000 mg | ORAL_TABLET | Freq: Four times a day (QID) | ORAL | Status: AC
  Administered 2020-04-20 (×2): 500 mg via ORAL
  Filled 2020-04-20 (×2): qty 2

## 2020-04-20 NOTE — Progress Notes (Signed)
PROGRESS NOTE    Sonya Craig   [id number]  DOB: 1927/12/04  PCP: Kirk Ruths, MD    DOA: 04/17/2020 LOS: 2   Brief Narrative   Summary of HPI on admission: 87 y.o. female with medical history significant for hypertension, chronic debility and weakness, type 2 diabetes with CKD stage 4, presented to the ED on 04/18/20 for chief concerns of generalized weakness and confusion since 2/24.  Pt with very poor PO intake over this time.  ED Course: BP uncontrolled [id number] with systolic up to [id number].  Afebrile, mild bradycardia in 50's. Labs showed hypercalcemia 13.4, renal function at baseline CKD-4.  UA negative.  CT head without acute findings.  Hospital course:  Admitted to hospitalist service and started on aggressive IV hydration for hypercalcemia. PT and OT consulted for evaluation given profound weakness.     Assessment & Plan   Principal Problem:   Hypercalcemia Active Problems:   Essential hypertension   CKD (chronic kidney disease), stage IV (HCC)   At risk for dehydration due to poor fluid intake   Protein malnutrition (HCC)   Debility   Protein-calorie malnutrition, severe   Hypercalcemia -presented with calcium 13.4.  Started on aggressive IV hydration.   Ca trend: 13.4>>13.0>>12.6>>11.7>>10.8 this afternoon --Will slow fluid rate to 200/hr>>100/hr --d/c Calcitonin  --Lasix if edema or other s/sx's volume overload --Thiazide discontinued, do not resume --Monitor on telemetry  Hypomagnesemia / Hypokalemia / Hypophosphatemia All being replaced and monitored.  Pharmacy consulted.  Monitor labs daily and replace as needed.  Generalized weakness -multifactorial due to malnutrition, general debility, hypercalcemia. --PT and OT rec HH  Severe Protein Calorie Malnutrition -weight down from 114 to 89 pounds, very poor p.o. intake at home recently.  Dietitian consulted, appreciate recommendation.  Supplemental shakes.  Encourage patient to eat and drink.  On  mirtazapine which should help with appetite.   Started trial low-dose dronabinol after d/w daughter. Monitor for side effects. Palliative care consulted.  CKD stage IV -renal function near baseline.  Monitor BMP.  Anemia of chronic renal disease -appears stable.  Monitor CBC.  Essential hypertension -chronic, stable for age.  Continue on lisinopril.  Stop HCTZ and do not resume, can cause hypercalcemia.  Add amlodipine.  Bilateral shoulder pain -chronic.  Uses liquid diclofenac solution topically at home.  Diclofenac gel ordered.  OT evaluation.  Patient BMI: Body mass index is 16.12 kg/m.   DVT prophylaxis: enoxaparin (LOVENOX) injection 30 mg Start: 04/17/20 2200 Place TED hose Start: 04/17/20 1618   Diet:  Diet Orders (From admission, onward)    Start     Ordered   04/17/20 1619  Diet regular Room service appropriate? Yes; Fluid consistency: Thin  Diet effective now       Question Answer Comment  Room service appropriate? Yes   Fluid consistency: Thin      04/17/20 1619            Code Status: DNR    Subjective 04/20/20    Patient has been mostly sleeping.  Still confused and per daughter's report seems having some hallucinations.  Does not seem distressed by them.  Has eaten a little, not much, but more than she was.   Disposition Plan & Communication   Status is: Inpatient  Remains inpatient appropriate because:IV treatments appropriate due to intensity of illness or inability to take PO.     Dispo: The patient is from: Home  Anticipated d/c is to: Home              Patient currently is not medically stable to d/c.   Difficult to place patient No  Family Communication: Daughter Kieth Brightly at bedside on rounds today.  Also discussed with daughter [name], charge RN on the unit.   Consults, Procedures, Significant Events   Consultants:   None  Procedures:   None  Antimicrobials:  Anti-infectives (From admission, onward)   None         Micro    Objective   Vitals:   04/20/20 0415 04/20/20 0757 04/20/20 1206 04/20/20 1526  BP: (!) 160/114 (!) 162/84 (!) 159/86 131/63  Pulse: 75 83 73 83  Resp:  '16 16 16  '$ Temp: 98.2 F (36.8 C) 98.4 F (36.9 C) 98.6 F (37 C) 99 F (37.2 C)  TempSrc:      SpO2: 98% 96% 97% 94%  Weight:      Height:        Intake/Output Summary (Last 24 hours) at 04/20/2020 1950 Last data filed at 04/20/2020 1849 Gross per 24 hour  Intake 3204.36 ml  Output 600 ml  Net 2604.36 ml   Filed Weights   04/17/20 1202 04/19/20 1410  Weight: 49.9 kg 42.6 kg    Physical Exam:  General exam: sleeping, only akes briefly to voice, no acute distress, frail, cachectic Respiratory system: clear anteriorly, normal respiratory effort, on room air. Cardiovascular system: RRR, trace BLE edema.   Extremities: no edema, normal tone  Labs   Data Reviewed: I have personally reviewed following labs and imaging studies  CBC: Recent Labs  Lab 04/17/20 1206 04/18/20 0512  WBC 4.7 4.3  HGB 11.6* 11.7*  HCT 35.6* 35.4*  MCV 91.3 89.8  PLT 269 [id number]   Basic Metabolic Panel: Recent Labs  Lab 04/17/20 1206 04/18/20 0512 04/18/20 0822 04/18/20 1703 04/19/20 0413 04/19/20 1553 04/20/20 0643 04/20/20 1634  NA 138 138  --  137 139  --  139  --   K 3.9 3.9  --  3.3* 3.1*  --  3.4*  --   CL 97* 99  --  97* 97*  --  101  --   CO2 34* 30  --  32 34*  --  31  --   GLUCOSE 126* 138*  --  121* 85  --  117*  --   BUN 40* 38*  --  35* 33*  --  26*  --   CREATININE 1.74* 1.70*  --  1.62* 1.64*  --  1.41*  --   CALCIUM 13.4* 13.0*   < > 12.6* 12.6* 12.9* 11.7* 10.8*  MG  --   --   --   --  1.4*  --  1.7  --   PHOS  --   --   --   --   --   --  2.1*  --    < > = values in this interval not displayed.   GFR: Estimated Creatinine Clearance: 17.5 mL/min (A) (by C-G formula based on SCr of 1.41 mg/dL (H)). Liver Function Tests: Recent Labs  Lab 04/17/20 1206  AST 28  ALT 13  ALKPHOS 55  BILITOT 0.9   PROT 7.2  ALBUMIN 3.6   No results for input(s): LIPASE, AMYLASE in the last 168 hours. Recent Labs  Lab 04/17/20 1625  AMMONIA 11   Coagulation Profile: No results for input(s): INR, PROTIME in the last 168 hours. Cardiac Enzymes: No  results for input(s): CKTOTAL, CKMB, CKMBINDEX, TROPONINI in the last 168 hours. BNP (last 3 results) No results for input(s): PROBNP in the last 8760 hours. HbA1C: No results for input(s): HGBA1C in the last 72 hours. CBG: No results for input(s): GLUCAP in the last 168 hours. Lipid Profile: No results for input(s): CHOL, HDL, LDLCALC, TRIG, CHOLHDL, LDLDIRECT in the last 72 hours. Thyroid Function Tests: No results for input(s): TSH, T4TOTAL, FREET4, T3FREE, THYROIDAB in the last 72 hours. Anemia Panel: No results for input(s): VITAMINB12, FOLATE, FERRITIN, TIBC, IRON, RETICCTPCT in the last 72 hours. Sepsis Labs: No results for input(s): PROCALCITON, LATICACIDVEN in the last 168 hours.  Recent Results (from the past 240 hour(s))  Resp Panel by RT-PCR (Flu A&B, Covid) Nasopharyngeal Swab     Status: None   Collection Time: 04/17/20  2:53 PM   Specimen: Nasopharyngeal Swab; Nasopharyngeal(NP) swabs in vial transport medium  Result Value Ref Range Status   SARS Coronavirus 2 by RT PCR NEGATIVE NEGATIVE Final    Comment: (NOTE) SARS-CoV-2 target nucleic acids are NOT DETECTED.  The SARS-CoV-2 RNA is generally detectable in upper respiratory specimens during the acute phase of infection. The lowest concentration of SARS-CoV-2 viral copies this assay can detect is 138 copies/mL. A negative result does not preclude SARS-Cov-2 infection and should not be used as the sole basis for treatment or other patient management decisions. A negative result may occur with  improper specimen collection/handling, submission of specimen other than nasopharyngeal swab, presence of viral mutation(s) within the areas targeted by this assay, and inadequate  number of viral copies(<138 copies/mL). A negative result must be combined with clinical observations, patient history, and epidemiological information. The expected result is Negative.  Fact Sheet for Patients:  EntrepreneurPulse.com.au  Fact Sheet for Healthcare Providers:  IncredibleEmployment.be  This test is no t yet approved or cleared by the [place] FDA and  has been authorized for detection and/or diagnosis of SARS-CoV-2 by FDA under an Emergency Use Authorization (EUA). This EUA will remain  in effect (meaning this test can be used) for the duration of the COVID-19 declaration under Section 564(b)(1) of the Act, 21 U.S.C.section 360bbb-3(b)(1), unless the authorization is terminated  or revoked sooner.       Influenza A by PCR NEGATIVE NEGATIVE Final   Influenza B by PCR NEGATIVE NEGATIVE Final    Comment: (NOTE) The Xpert Xpress SARS-CoV-2/FLU/RSV plus assay is intended as an aid in the diagnosis of influenza from Nasopharyngeal swab specimens and should not be used as a sole basis for treatment. Nasal washings and aspirates are unacceptable for Xpert Xpress SARS-CoV-2/FLU/RSV testing.  Fact Sheet for Patients: EntrepreneurPulse.com.au  Fact Sheet for Healthcare Providers: IncredibleEmployment.be  This test is not yet approved or cleared by the [place] FDA and has been authorized for detection and/or diagnosis of SARS-CoV-2 by FDA under an Emergency Use Authorization (EUA). This EUA will remain in effect (meaning this test can be used) for the duration of the COVID-19 declaration under Section 564(b)(1) of the Act, 21 U.S.C. section 360bbb-3(b)(1), unless the authorization is terminated or revoked.  Performed at [facility], [address], [place], [place] [address]       Imaging Studies   DG Chest [place] 1 View  Result Date: 04/20/2020 CLINICAL DATA:   Hypercalcemia EXAM: PORTABLE CHEST 1 VIEW COMPARISON:  None. FINDINGS: Eventration of the right hemidiaphragm. No confluent airspace opacities or effusions. Heart is normal size. Aortic atherosclerosis. IMPRESSION: No active disease. Electronically Signed  By: Rolm Baptise M.D.   On: 04/20/2020 08:49     Medications   Scheduled Meds: . amLODipine  10 mg Oral Daily  . aspirin EC  81 mg Oral Q0600  . calcitonin  4 Units/kg Subcutaneous BID  . diclofenac Sodium  4 g Topical QID  . dronabinol  2.5 mg Oral BID AC  . enoxaparin (LOVENOX) injection  30 mg Subcutaneous Q24H  . feeding supplement  237 mL Oral BID BM  . lisinopril  20 mg Oral BID  . mirtazapine  7.5 mg Oral QHS  . multivitamin with minerals  1 tablet Oral Daily  . phosphorus  500 mg Oral Q6H  . potassium chloride  20 mEq Oral BID  . pravastatin  40 mg Oral q1800   Continuous Infusions: . sodium chloride 200 mL/hr at 04/20/20 1822       LOS: 2 days    Time spent: 25 minutes with greater than 50% spent at bedside in coordination of care.    Ezekiel Slocumb, DO [facility]  04/20/2020, 7:50 PM      If 7PM-7AM, please contact night-coverage. How to contact the [facility] Attending or Consulting provider [place] or covering provider during after hours [place], for this patient?    1. Check the care team in [facility] and look for a) attending/consulting TRH provider listed and b) the [facility] team listed 2. Log into [email or web address] and use [place]'s universal password to access. If you do not have the password, please contact the hospital operator. 3. Locate the [facility] provider you are looking for under [facility] and page to a number that you can be directly reached. 4. If you still have difficulty reaching the provider, please page the [facility] (Director on Call) for the Hospitalists listed on amion for assistance.

## 2020-04-20 NOTE — Progress Notes (Addendum)
PHARMACY CONSULT NOTE - FOLLOW UP  Pharmacy Consult for Electrolyte Monitoring and Replacement   Recent Labs: Potassium (mmol/L)  Date Value  04/20/2020 3.4 (L)   Magnesium (mg/dL)  Date Value  04/20/2020 1.7   Calcium (mg/dL)  Date Value  04/20/2020 11.7 (H)   Calcium, Total (PTH) (mg/dL)  Date Value  04/18/2020 13.7   Albumin (g/dL)  Date Value  04/17/2020 3.6   Phosphorus (mg/dL)  Date Value  04/20/2020 2.1 (L)   Sodium (mmol/L)  Date Value  04/20/2020 139     Assessment: 87 yo female admitted with weakness and confusion. Found to hypercalcemia. Patient has PMH HTN, CKD, Protein malnutrition, and anemia. Pharmacy has been consulted for electrolyte monitoring and replacement.   Patient is receiving Calcitonin 200 units BID for 8 doses.   Goal of Therapy:  Electrolytes WNL   Plan:  3/1 K: 3.4, Mg: 1.7, Phos: 2.1. Patient has order for KCL 51mq x 2 doses today.   Will order magnesium 2g IV x 1 dose and  Kphos neutral tabs  '500mg'$  BID x 2 doses.   Pharmacy will continue to monitor and replace electrolyte as needed. Plan to F/U with AM labs.    SPernell Dupre PharmD, BCPS Clinical Pharmacist 04/20/2020 1:07 PM

## 2020-04-20 NOTE — Plan of Care (Signed)

## 2020-04-20 NOTE — Plan of Care (Signed)
PMT note:  Consult noted. Family meeting scheduled  for 9:00 tomorrow morning.

## 2020-04-21 ENCOUNTER — Inpatient Hospital Stay: Payer: Medicare Other

## 2020-04-21 DIAGNOSIS — N179 Acute kidney failure, unspecified: Secondary | ICD-10-CM

## 2020-04-21 DIAGNOSIS — Z7189 Other specified counseling: Secondary | ICD-10-CM

## 2020-04-21 DIAGNOSIS — E43 Unspecified severe protein-calorie malnutrition: Secondary | ICD-10-CM | POA: Diagnosis not present

## 2020-04-21 LAB — MAGNESIUM: Magnesium: 1.7 mg/dL (ref 1.7–2.4)

## 2020-04-21 LAB — BASIC METABOLIC PANEL
Anion gap: 7 (ref 5–15)
BUN: 22 mg/dL (ref 8–23)
CO2: 26 mmol/L (ref 22–32)
Calcium: 10 mg/dL (ref 8.9–10.3)
Chloride: 109 mmol/L (ref 98–111)
Creatinine, Ser: 1.24 mg/dL — ABNORMAL HIGH (ref 0.44–1.00)
GFR, Estimated: 41 mL/min — ABNORMAL LOW (ref >60)
Glucose, Bld: 57 mg/dL — ABNORMAL LOW (ref 70–99)
Potassium: 2.9 mmol/L — ABNORMAL LOW (ref 3.5–5.1)
Sodium: 142 mmol/L (ref 135–145)

## 2020-04-21 LAB — CBC
HCT: 27.8 % — ABNORMAL LOW (ref 36.0–46.0)
Hemoglobin: 9.1 g/dL — ABNORMAL LOW (ref 12.0–15.0)
MCH: 29.6 pg (ref 26.0–34.0)
MCHC: 32.7 g/dL (ref 30.0–36.0)
MCV: 90.6 fL (ref 80.0–100.0)
Platelets: 233 10*3/uL (ref 150–400)
RBC: 3.07 MIL/uL — ABNORMAL LOW (ref 3.87–5.11)
RDW: 17.2 % — ABNORMAL HIGH (ref 11.5–15.5)
WBC: 4.4 10*3/uL (ref 4.0–10.5)
nRBC: 0 % (ref 0.0–0.2)

## 2020-04-21 LAB — PHOSPHORUS: Phosphorus: 4.3 mg/dL (ref 2.5–4.6)

## 2020-04-21 MED ORDER — POTASSIUM CHLORIDE 20 MEQ PO PACK
40.0000 meq | PACK | ORAL | Status: AC
  Administered 2020-04-21: 40 meq via ORAL
  Filled 2020-04-21: qty 2

## 2020-04-21 MED ORDER — POTASSIUM CHLORIDE 20 MEQ PO PACK: 40.0000 meq | PACK | ORAL | Status: DC

## 2020-04-21 MED ORDER — MAGNESIUM SULFATE IN D5W 1-5 GM/100ML-% IV SOLN
1.0000 g | Freq: Once | INTRAVENOUS | Status: AC
  Administered 2020-04-21: 1 g via INTRAVENOUS
  Filled 2020-04-21: qty 100

## 2020-04-21 NOTE — Plan of Care (Signed)

## 2020-04-21 NOTE — Progress Notes (Signed)
PROGRESS NOTE  Sonya Craig [id number] DOB: [birth year] DOA: 04/17/2020 PCP: Kirk Ruths, MD  Brief History   87 year old woman multiple medical problems presented with generalized weakness and confusion with poor oral intake.  Admitted for hypercalcemia, failure to thrive, generalized weakness.  A & P  Hypercalcemia on admission resolved now with aggressive IV hydration and calcitonin. --May have been secondary to viral chlorothiazide which has been discontinued.  Monitor.  Hypomagnesemia, hypokalemia and hypophosphatemia --replete as needed  Severe protein calorie malnutrition with failure to thrive and weight loss --Continue management as per dietitian.  Started on dronabinol  AKI on CKD IIIb --renal function improving  Anemia of CKD --Hgb down probably from dilution  Chronic bilateral shoulder pain --Diclofenac gel  Generalized weakness --Supportive care.  Disposition Plan:  Discussion: working w/ PMT, family may take home w/ hospice  Status is: Inpatient  Remains inpatient appropriate because:Inpatient level of care appropriate due to severity of illness   Dispo: The patient is from: Home              Anticipated d/c is to: Home              Patient currently is not medically stable to d/c.   Difficult to place patient No  DVT prophylaxis: enoxaparin (LOVENOX) injection 30 mg Start: 04/17/20 2200 Place TED hose Start: 04/17/20 1618   Code Status: DNR Level of care: Med-Surg Family Communication: none  Murray Hodgkins, MD  [facility] Direct contact: see [email or web address] (further directions at bottom of note if needed) 7PM-7AM contact night coverage as at bottom of note 04/21/2020, 6:30 PM  LOS: 3 days   Significant Hospital Events   .    Consults:  .    Procedures:  .   Significant Diagnostic Tests:  Marland Kitchen    Micro Data:  .    Antimicrobials:  .   Interval History/Subjective  CC: f/u FTT  Feels ok  Objective   Vitals:   Vitals:   04/21/20 1127 04/21/20 1530  BP: 139/70 (!) 143/70  Pulse: (!) 59 62  Resp: 15 18  Temp: 98 F (36.7 C) 98.2 F (36.8 C)  SpO2: 97% 96%    Exam:  Constitutional:   . Appears calm and comfortable ENMT:  . grossly normal hearing  Respiratory:  . CTA bilaterally, no w/r/r.  . Respiratory effort normal.  Cardiovascular:  . RRR, no m/r/g Psychiatric:  . Mental status o Mood, affect appropriate  I have personally reviewed the following:   Today's Data  . K+ 2.9 . Creatinine down to 1.24 . Hgb down to 9.1  Scheduled Meds: . amLODipine  10 mg Oral Daily  . aspirin EC  81 mg Oral Q0600  . diclofenac Sodium  4 g Topical QID  . dronabinol  2.5 mg Oral BID AC  . enoxaparin (LOVENOX) injection  30 mg Subcutaneous Q24H  . feeding supplement  237 mL Oral BID BM  . mirtazapine  7.5 mg Oral QHS  . multivitamin with minerals  1 tablet Oral Daily  . pravastatin  40 mg Oral q1800   Continuous Infusions: . sodium chloride 100 mL/hr at 04/20/20 2101    Principal Problem:   Hypercalcemia Active Problems:   Essential hypertension   CKD (chronic kidney disease), stage IV (HCC)   At risk for dehydration due to poor fluid intake   Protein malnutrition (HCC)   Debility   Protein-calorie malnutrition, severe   LOS: 3 days   How to contact  the [facility] Attending or Consulting provider [place] or covering provider during after hours [place], for this patient?  1. Check the care team in [facility] and look for a) attending/consulting TRH provider listed and b) the [facility] team listed 2. Log into [email or web address] and use [place]'s universal password to access. If you do not have the password, please contact the hospital operator. 3. Locate the [facility] provider you are looking for under [facility] and page to a number that you can be directly reached. 4. If you still have difficulty reaching the provider, please page the [facility] (Director on Call) for the Hospitalists listed on amion for  assistance.

## 2020-04-21 NOTE — Progress Notes (Signed)
Pt had difficulty following commands swallowing pills when given in applesauce - pills removed from pt mouth by nurse.

## 2020-04-21 NOTE — Progress Notes (Signed)
PHARMACY CONSULT NOTE - FOLLOW UP  Pharmacy Consult for Electrolyte Monitoring and Replacement   Recent Labs: Potassium (mmol/L)  Date Value  04/21/2020 2.9 (L)   Magnesium (mg/dL)  Date Value  04/21/2020 1.7   Calcium (mg/dL)  Date Value  04/21/2020 10.0   Calcium, Total (PTH) (mg/dL)  Date Value  04/18/2020 13.7   Albumin (g/dL)  Date Value  04/17/2020 3.6   Phosphorus (mg/dL)  Date Value  04/21/2020 4.3   Sodium (mmol/L)  Date Value  04/21/2020 142     Assessment: 87 yo female admitted with weakness and confusion. Found to hypercalcemia. Patient has PMH HTN, CKD, Protein malnutrition, and anemia. Pharmacy has been consulted for electrolyte monitoring and replacement.   Patient is receiving Calcitonin 200 units BID for 8 doses.   Goal of Therapy:  Electrolytes WNL   Plan:  3/2 K: 2.9, Mg: 1.7, Phos: 4.3. Patient has order for KCL 50mq x 2 doses today.   Pharmacy will continue to monitor and replace electrolyte as needed. Plan to F/U with AM labs.    LPaulina Fusi PharmD, BCPS 04/21/2020 2:38 PM

## 2020-04-21 NOTE — Consult Note (Signed)
Consultation Note Date: 04/21/2020   Patient Name: Sonya Craig  DOB: Feb 21, 1927  MRN: [id number]  Age / Sex: 87 y.o., female  PCP: Kirk Ruths, MD Referring Physician: Samuella Cota, MD  Reason for Consultation: Establishing goals of care  HPI/Patient Profile: Sonya Craig is a 87 y.o. female with medical history significant for hypertension, chronic debility and weakness, CKD 4, non-insulin-dependent diabetes mellitus, presents to the emergency department for chief concerns of generalized weakness and confusion.  Clinical Assessment and Goals of Care: Patient is resting in bed with staff at bedside working with her.  She smiles when being spoken to but does not answer questions.  Her daughter [name] who is a Marine scientist for [employer] is at bedside.  She states that she and her mother live together, and have done so since 2018.  She states prior to them living together, her mother was not specific about the reason, but had just decided that she did not want to live alone any longer; [name] states that she thinks her mother had fallen.  She tells me that her mother is divorced, and has raised 6 children.  Patient does not have a healthcare power of attorney.  She states prior to January, her mother was functionally independent, and was dancing at Christmas.  She states that she has significantly declined since then.  She states that her mother has had a significant decline in weight, and has been hiding food in various places, including the seat of her Rollator, so that her family would not know that she had not been eating.  She states that her mother has been on Remeron for over a year.  Second daughter entered the conversation.  We discussed her diagnosis, prognosis, GOC, EOL wishes disposition and options.  Created space and opportunity for patient  to explore thoughts and feelings  regarding current medical information.   A detailed discussion was had today regarding advanced directives.  Concepts specific to code status, artifical feeding and hydration, IV antibiotics and rehospitalization were discussed.  The difference between an aggressive medical intervention path and a comfort care path was discussed.  Values and goals of care important to patient and family were attempted to be elicited.  Discussed limitations of medical interventions to prolong quality of life in some situations and discussed the concept of human mortality.  She states that on Thursday her mother began to be confused and talking to people who were not present.  She states that her mother began talking to deceased family members.  She states that if she had not brought her to the hospital she feels that her mother would have died.  She tells me her mother has been clear that she is tired and that she is ready to go, but she states this has been difficult for the family.  The women would like to honor their mother's wishes and transition focus to comfort.  They would like to leave care as it is for today so that they are able  to talk to the other family members.  Plan for family meeting tomorrow at 2:00.    SUMMARY OF RECOMMENDATIONS   Plans for family meeting tomorrow at 2:00 as there is no H POA.  Family discussing taking patient home with hospice.  Prognosis:   < 3 months      Primary Diagnoses: Present on Admission:  Hypercalcemia  Essential hypertension  Protein malnutrition ([place])  Debility   I have reviewed the medical record, interviewed the patient and family, and examined the patient. The following aspects are pertinent.  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 2 diabetes mellitus ([place])    Social History   Socioeconomic History    Marital status: Divorced    Spouse name: Not on file   Number of children: Not on file   Years of education: Not on file   Highest education level: Not on file  Occupational History   Not on file  Tobacco Use   Smoking status: Never Smoker   Smokeless tobacco: Never Used  Substance and Sexual Activity   Alcohol use: Yes    Comment: occasionally   Drug use: Never   Sexual activity: Not on file  Other Topics Concern   Not on file  Social History Narrative   Not on file   Social Determinants of Health   Financial Resource Strain: Not on file  Food Insecurity: Not on file  Transportation Needs: Not on file  Physical Activity: Not on file  Stress: Not on file  Social Connections: Not on file   History reviewed. No pertinent family history. Scheduled Meds:  amLODipine  10 mg Oral Daily   aspirin EC  81 mg Oral Q0600   diclofenac Sodium  4 g Topical QID   dronabinol  2.5 mg Oral BID AC   enoxaparin (LOVENOX) injection  30 mg Subcutaneous Q24H   feeding supplement  237 mL Oral BID BM   lisinopril  20 mg Oral BID   mirtazapine  7.5 mg Oral QHS   multivitamin with minerals  1 tablet Oral Daily   potassium chloride  40 mEq Oral Q4H   pravastatin  40 mg Oral q1800   Continuous Infusions:  sodium chloride 100 mL/hr at 04/20/20 2101   PRN Meds:.ondansetron **OR** ondansetron (ZOFRAN) IV Medications Prior to Admission:  Prior to Admission medications   Medication Sig Start Date End Date Taking? Authorizing Provider  aspirin 81 MG EC tablet Take 81 mg by mouth daily at 6 (six) AM.   Yes [provider]  Diclofenac Sodium 1.5 % SOLN APPLY 40 DROPS TOPICALLY 4 TIMES A DAY TO AFFECTED AREA 02/24/20  Yes [provider]  lisinopril-hydrochlorothiazide (ZESTORETIC) 20-12.5 MG tablet Take 1 tablet by mouth 2 (two) times daily. 07/28/19  Yes [provider]  lovastatin (MEVACOR) 40 MG tablet Take 40 mg by mouth daily. 06/06/19  Yes [provider]  mirtazapine (REMERON) 7.5 MG tablet Take 7.5 mg by mouth at bedtime. 06/06/19  Yes [provider]  amitriptyline (ELAVIL) 10 MG tablet Take 10 mg by mouth at bedtime. Patient not taking: No sig reported 06/30/19   [provider]   No Known Allergies Review of Systems  Unable to perform ROS Constitutional:       Patient smiles but does not answer questions.    Physical Exam Pulmonary:  Effort: Pulmonary effort is normal.  Neurological:     Mental Status: She is alert.     Vital Signs: BP 139/70 (BP Location: Left Arm)    Pulse (!) 59    Temp 98 F (36.7 C)    Resp 15    Ht '5\' 4"'$  (1.626 m)    Wt 42.6 kg Comment: bed scale   SpO2 97%    BMI 16.12 kg/m  Pain Scale: 0-10   Pain Score: 0-No pain   SpO2: SpO2: 97 % O2 Device:SpO2: 97 % O2 Flow Rate: .   IO: Intake/output summary:   Intake/Output Summary (Last 24 hours) at 04/21/2020 1156 Last data filed at 04/21/2020 0428 Gross per 24 hour  Intake 3204.36 ml  Output 0 ml  Net 3204.36 ml    LBM: Last BM Date: 04/21/20 Baseline Weight: Weight: 49.9 kg Most recent weight: Weight: 42.6 kg (bed scale)      Time In: 9:30 Time Out: 10:15 Time Total: 45 min Greater than 50%  of this time was spent counseling and coordinating care related to the above assessment and plan.  Signed by: Asencion Gowda, NP   Please contact Palliative Medicine Team phone at [phone] for questions and concerns.  For individual provider: See Shea Evans

## 2020-04-21 NOTE — Progress Notes (Signed)
Patient transported to CT at this time accompanied by this nurse.

## 2020-04-21 NOTE — Progress Notes (Signed)
This nurse for intentional rounding and noted hematoma to patients right forehead of unknown origin. Patient resting in bed at lowest position with bed alarm engaged. Call bell positioned beside right side of  patients head. No fall or injury noted as patient has been somnolent all shift. Also of note, no other injuries noted per assessment by this nurse. Family notified. NP B. Randol Kern notified. New order CT of head.

## 2020-04-22 DIAGNOSIS — Z515 Encounter for palliative care: Secondary | ICD-10-CM

## 2020-04-22 DIAGNOSIS — N1832 Chronic kidney disease, stage 3b: Secondary | ICD-10-CM | POA: Diagnosis not present

## 2020-04-22 DIAGNOSIS — E876 Hypokalemia: Secondary | ICD-10-CM | POA: Diagnosis not present

## 2020-04-22 DIAGNOSIS — E43 Unspecified severe protein-calorie malnutrition: Secondary | ICD-10-CM | POA: Diagnosis not present

## 2020-04-22 DIAGNOSIS — Z7189 Other specified counseling: Secondary | ICD-10-CM | POA: Diagnosis not present

## 2020-04-22 LAB — BASIC METABOLIC PANEL
Anion gap: 10 (ref 5–15)
BUN: 19 mg/dL (ref 8–23)
CO2: 23 mmol/L (ref 22–32)
Calcium: 9.6 mg/dL (ref 8.9–10.3)
Chloride: 108 mmol/L (ref 98–111)
Creatinine, Ser: 1.22 mg/dL — ABNORMAL HIGH (ref 0.44–1.00)
GFR, Estimated: 42 mL/min — ABNORMAL LOW (ref >60)
Glucose, Bld: 67 mg/dL — ABNORMAL LOW (ref 70–99)
Potassium: 3.1 mmol/L — ABNORMAL LOW (ref 3.5–5.1)
Sodium: 141 mmol/L (ref 135–145)

## 2020-04-22 LAB — MAGNESIUM: Magnesium: 1.6 mg/dL — ABNORMAL LOW (ref 1.7–2.4)

## 2020-04-22 LAB — PHOSPHORUS: Phosphorus: 2.6 mg/dL (ref 2.5–4.6)

## 2020-04-22 MED ORDER — ONDANSETRON HCL 4 MG/2ML IJ SOLN: 4.0000 mg | Freq: Four times a day (QID) | INTRAMUSCULAR | Status: DC | PRN

## 2020-04-22 MED ORDER — GLYCOPYRROLATE 0.2 MG/ML IJ SOLN
0.2000 mg | INTRAMUSCULAR | Status: DC | PRN
  Filled 2020-04-22: qty 1

## 2020-04-22 MED ORDER — OXYCODONE HCL 5 MG PO TABS: 5.0000 mg | ORAL_TABLET | ORAL | Status: DC | PRN

## 2020-04-22 MED ORDER — HALOPERIDOL 0.5 MG PO TABS
0.5000 mg | ORAL_TABLET | ORAL | Status: DC | PRN
  Filled 2020-04-22: qty 1

## 2020-04-22 MED ORDER — POLYVINYL ALCOHOL 1.4 % OP SOLN
1.0000 [drp] | Freq: Four times a day (QID) | OPHTHALMIC | Status: DC | PRN
  Filled 2020-04-22: qty 15

## 2020-04-22 MED ORDER — ONDANSETRON 4 MG PO TBDP
4.0000 mg | ORAL_TABLET | Freq: Four times a day (QID) | ORAL | Status: DC | PRN
  Filled 2020-04-22: qty 1

## 2020-04-22 MED ORDER — ACETAMINOPHEN 325 MG PO TABS: 650.0000 mg | ORAL_TABLET | Freq: Four times a day (QID) | ORAL | Status: DC | PRN

## 2020-04-22 MED ORDER — LORAZEPAM 2 MG/ML PO CONC: 1.0000 mg | ORAL | Status: DC | PRN

## 2020-04-22 MED ORDER — ACETAMINOPHEN 650 MG RE SUPP: 650.0000 mg | Freq: Four times a day (QID) | RECTAL | Status: DC | PRN

## 2020-04-22 MED ORDER — OXYCODONE HCL 20 MG/ML PO CONC: 5.0000 mg | ORAL | Status: DC | PRN

## 2020-04-22 MED ORDER — HALOPERIDOL LACTATE 2 MG/ML PO CONC
0.5000 mg | ORAL | Status: DC | PRN
Start: 2020-04-22 — End: 2020-04-23
  Filled 2020-04-22: qty 0.3

## 2020-04-22 MED ORDER — LORAZEPAM 2 MG/ML IJ SOLN
1.0000 mg | INTRAMUSCULAR | Status: DC | PRN
Start: 2020-04-22 — End: 2020-04-23
  Administered 2020-04-23: 1 mg via INTRAVENOUS
  Filled 2020-04-22: qty 1

## 2020-04-22 MED ORDER — HALOPERIDOL LACTATE 5 MG/ML IJ SOLN: 0.5000 mg | INTRAMUSCULAR | Status: DC | PRN

## 2020-04-22 MED ORDER — MAGNESIUM SULFATE IN D5W 1-5 GM/100ML-% IV SOLN
1.0000 g | Freq: Once | INTRAVENOUS | Status: AC
  Administered 2020-04-22: 1 g via INTRAVENOUS
  Filled 2020-04-22: qty 100

## 2020-04-22 MED ORDER — POTASSIUM CHLORIDE 10 MEQ/100ML IV SOLN
10.0000 meq | INTRAVENOUS | Status: AC
  Administered 2020-04-22 (×2): 10 meq via INTRAVENOUS
  Filled 2020-04-22 (×2): qty 100

## 2020-04-22 MED ORDER — BIOTENE DRY MOUTH MT LIQD: 15.0000 mL | OROMUCOSAL | Status: DC | PRN

## 2020-04-22 MED ORDER — GLYCOPYRROLATE 1 MG PO TABS
1.0000 mg | ORAL_TABLET | ORAL | Status: DC | PRN
  Filled 2020-04-22: qty 1

## 2020-04-22 MED ORDER — POTASSIUM CHLORIDE 10 MEQ/100ML IV SOLN
10.0000 meq | INTRAVENOUS | Status: AC
  Administered 2020-04-22 (×2): 10 meq via INTRAVENOUS
  Filled 2020-04-22 (×3): qty 100

## 2020-04-22 MED ORDER — LORAZEPAM 1 MG PO TABS: 1.0000 mg | ORAL_TABLET | ORAL | Status: DC | PRN

## 2020-04-22 NOTE — Progress Notes (Signed)
OT Cancellation Note  Patient Details Name: Sonya Craig MRN: [id number] DOB: [birth year]   Cancelled Treatment:    Reason Eval/Treat Not Completed: Other (comment) Pt and family meeting with palliative care to discuss pt's goals related to hospital course and discharge. OT will follow up when pt next available.   Darleen Crocker, MS, OTR/L , CBIS ascom [phone]  04/22/20, 2:18 PM   04/22/2020, 2:16 PM

## 2020-04-22 NOTE — Progress Notes (Signed)
PROGRESS NOTE  Sonya Craig [id number] DOB: Jul 29, 1927 DOA: 04/17/2020 PCP: Kirk Ruths, MD  Brief History   87 year old woman multiple medical problems presented with generalized weakness and confusion with poor oral intake.  Admitted for hypercalcemia, failure to thrive, generalized weakness.  A & P  Hypercalcemia on admission resolved now with aggressive IV hydration and calcitonin. --May have been secondary to hydrochlorothiazide which has been discontinued.  --Ca2+ continues to trend down  Hypomagnesemia, hypokalemia  --replete  Severe protein calorie malnutrition with failure to thrive, generalized weakness and weight loss --comfort care  AKI on CKD IIIb --renal function stable  Anemia of CKD --Hgb down probably from dilution  Chronic bilateral shoulder pain --Diclofenac gel  Generalized weakness --Supportive care.  Disposition Plan:  Discussion: discussed w/ 2 daughters at bedside and w/ PMT prior who met w/ multiple family members. Patient and family have elected home w/ hospice. Plan home when equipment delivered.   Status is: Inpatient  Remains inpatient appropriate because:Inpatient level of care appropriate due to severity of illness   Dispo: The patient is from: Home              Anticipated d/c is to: Home              Patient currently is not medically stable to d/c.   Difficult to place patient No  DVT prophylaxis: Place TED hose Start: 04/17/20 1618   Code Status: DNR Level of care: Med-Surg Family Communication: 2 daughters at bedside  Murray Hodgkins, MD  [facility] Direct contact: see [email or web address] (further directions at bottom of note if needed) 7PM-7AM contact night coverage as at bottom of note 04/22/2020, 5:06 PM  LOS: 4 days   Significant Hospital Events   .    Consults:  .    Procedures:  .   Significant Diagnostic Tests:  Marland Kitchen    Micro Data:  .    Antimicrobials:  .   Interval History/Subjective  CC: f/u  FTT  Seems to feel ok 2 daughters at bedside, they desire comfort care Family met w/ PMT and selected comfort care, home w/ hospice  Objective   Vitals:  Vitals:   04/22/20 1102 04/22/20 1517  BP: (!) 154/74 (!) 143/66  Pulse: 63 75  Resp: 15 15  Temp: 98.4 F (36.9 C) 98.4 F (36.9 C)  SpO2: 99% 97%    Exam:  Constitutional:   . Appears calm and comfortable ENMT:  . grossly normal hearing  Respiratory:  . CTA bilaterally, no w/r/r.  . Respiratory effort normal.  Cardiovascular:  . RRR, no m/r/g . No LE extremity edema   Psychiatric:  . Mental status o Mood, affect appropriate  I have personally reviewed the following:   Today's Data  . K+ 3.1 . Glucose 67 . Creatinine 1.22 . Phos 2.6 . Mg 1.6 . Ca2+ 9.6  Scheduled Meds: . amLODipine  10 mg Oral Daily  . aspirin EC  81 mg Oral Q0600  . diclofenac Sodium  4 g Topical QID   Continuous Infusions: . potassium chloride      Principal Problem:   Hypercalcemia Active Problems:   Essential hypertension   CKD (chronic kidney disease), stage IV (HCC)   At risk for dehydration due to poor fluid intake   Protein malnutrition (HCC)   Debility   Protein-calorie malnutrition, severe   LOS: 4 days   How to contact the [facility] Attending or Consulting provider 7A - 7P or covering provider during after  hours 7P -7A, for this patient?  1. Check the care team in [facility] and look for a) attending/consulting TR[facility] provider listed and b) the [facility] team listed 2. Log into [email or web address] and use [place]'s universal password to access. If you do not have the password, please contact the hospital operator. 3. Locate the [facility] provider you are looking for under [facility] and page to a number that you can be directly reached. 4. If you still have difficulty reaching the provider, please page the [facility] (Director on Call) for the Hospitalists listed on amion for assistance.

## 2020-04-22 NOTE — Progress Notes (Signed)
Daily Progress Note   Patient Name: Sonya Craig       Date: 04/22/2020 DOB: [birth year]  Age: 87 y.o. MRN#: [id number] Attending Physician: Samuella Cota, MD Primary Care Physician: Kirk Ruths, MD Admit Date: 04/17/2020  Reason for Consultation/Follow-up: Establishing goals of care  Subjective: Patient is resting in bed. 5 of her children are at bedside.    We discussed her diagnoses, prognosis, GOC, EOL wishes disposition and options.  Created space and opportunity for patient  to explore thoughts and feelings regarding current medical information.   A detailed discussion was had today regarding advanced directives.  Concepts specific to code status, artifical feeding and hydration, IV antibiotics and rehospitalization were discussed.  The difference between an aggressive medical intervention path and a comfort care path was discussed.  Values and goals of care important to patient and family were attempted to be elicited.  Discussed limitations of medical interventions to prolong quality of life in some situations and discussed the concept of human mortality.  Patient states she wants to go home and be with her children. She states she does not want to be the cause of any dispute and wants to make sure "everyone is satisfied" with the plans moving forward.  Family discusses how care will be provided for her so that someone is always there, and patient was reminded that everyone loves her and wants what is best for her, and to honor her wishes, and are working on planning.   I completed a MOST form today with patient and 5 children, and the signed original was scanned to EMR.  A photocopy was placed in the chart to be scanned into EMR. The form was signed by daughter [name] as  patient wanted her to sign it. The patient outlined their wishes for the following treatment decisions:  Cardiopulmonary Resuscitation: Do Not Attempt Resuscitation (DNR/No CPR)  Medical Interventions: Comfort Measures: Keep clean, warm, and dry. Use medication by any route, positioning, wound care, and other measures to relieve pain and suffering. Use oxygen, suction and manual treatment of airway obstruction as needed for comfort. Do not transfer to the hospital unless comfort needs cannot be met in current location.  Antibiotics: No antibiotics (use other measures to relieve symptoms)  IV Fluids: No IV fluids (provide other measures to ensure comfort)  Feeding Tube: No feeding tube       Length of Stay: 4  Current Medications: Scheduled Meds:  . amLODipine  10 mg Oral Daily  . aspirin EC  81 mg Oral Q0600  . diclofenac Sodium  4 g Topical QID  . dronabinol  2.5 mg Oral BID AC  . enoxaparin (LOVENOX) injection  30 mg Subcutaneous Q24H  . feeding supplement  237 mL Oral BID BM  . mirtazapine  7.5 mg Oral QHS  . multivitamin with minerals  1 tablet Oral Daily  . pravastatin  40 mg Oral q1800    Continuous Infusions: . sodium chloride 100 mL/hr at 04/20/20 2101    PRN Meds: ondansetron **OR** ondansetron (ZOFRAN) IV  Physical Exam Pulmonary:     Effort: Pulmonary effort is normal.  Neurological:     Mental Status: She is alert.             Vital Signs: BP (!) 154/74 (BP Location: Left Arm)   Pulse 63   Temp 98.4 F (36.9 C)   Resp 15   Ht 5' 4"  (1.626 m)   Wt 42.6 kg Comment: bed scale  SpO2 99%   BMI 16.12 kg/m  SpO2: SpO2: 99 % O2 Device: O2 Device: Room Air O2 Flow Rate:    Intake/output summary:   Intake/Output Summary (Last 24 hours) at 04/22/2020 1458 Last data filed at 04/22/2020 1300 Gross per 24 hour  Intake 120 ml  Output --  Net 120 ml   LBM: Last BM Date: 04/21/20 Baseline Weight: Weight: 49.9 kg Most recent weight: Weight: 42.6 kg (bed  scale)         Patient Active Problem List   Diagnosis Date Noted  . Protein-calorie malnutrition, severe 04/19/2020  . Hypercalcemia 04/17/2020  . Essential hypertension 04/17/2020  . CKD (chronic kidney disease), stage IV ([place]) 04/17/2020  . At risk for dehydration due to poor fluid intake 04/17/2020  . Protein malnutrition ([place]) 04/17/2020  . Debility 04/17/2020    Palliative Care Assessment & Plan    Recommendations/Plan:  Home with hospice.     Code Status:    Code Status Orders  (From admission, onward)         Start     Ordered   04/17/20 1618  Do not attempt resuscitation (DNR)  Continuous       Question Answer Comment  In the event of cardiac or respiratory ARREST Do not call a "code blue"   In the event of cardiac or respiratory ARREST Do not perform Intubation, CPR, defibrillation or ACLS   In the event of cardiac or respiratory ARREST Use medication by any route, position, wound care, and other measures to relive pain and suffering. May use oxygen, suction and manual treatment of airway obstruction as needed for comfort.      04/17/20 1619        Code Status History    This patient has a current code status but no historical code status.   Advance Care Planning Activity       Prognosis:  < 6 months    Care plan was discussed with TOC  Thank you for allowing the Palliative Medicine Team to assist in the care of this patient.   Time In: 2:00 Time Out: 3:00 Total Time 60 min Prolonged Time Billed no      Greater than 50%  of this time was spent counseling and coordinating care related to the above assessment and plan.  Emely Fahy  Laurann [place], NP  Please contact Palliative Medicine Team phone at [phone] for questions and concerns.

## 2020-04-22 NOTE — Plan of Care (Signed)

## 2020-04-22 NOTE — Progress Notes (Signed)
[facility]) Hospital Liaison RN note:  Received new referral from Asencion Gowda, NP and Jhonnie Garner, [facility] for hospice services at home after discharge. Chart and patient information under review by [facility] physician. Hospice eligibility has been approved.  Spoke with daughter, [name] to initiate education related to hospice philosophy, services and to answer any questions. [name] verbalized understanding and had no questions. Plan is for patient to discharge home with [name] once DME is in place.  DME needs discussed. [facility] requests a hospital bed, OBT, wheelchair, O2 PRN. Address has been verified and is correct on the chart. Contact is [name] for arranging delivery of equipment.  Please send signed and completed DNR home with patient. Please provide prescriptions at discharge as needed to ensure ongoing symptom management.  Please call with any hospice related questions or concerns.  Thank you for the opportunity to participate in this patient's care.  Zandra Abts, RN [facility] Liaison [phone]

## 2020-04-22 NOTE — Progress Notes (Signed)
Physical Therapy Treatment Patient Details Name: Sonya Craig MRN: [id number] DOB: 1927/12/05 Today's Date: 04/22/2020    History of Present Illness Pt is a 87 y.o. female with medical history significant for hypertension, chronic debility and weakness, CKD 4, non-insulin-dependent diabetes mellitus, presented to the ED on 04/18/20 for chief concerns of generalized weakness and confusion since 2/24.  MD assessment includes: hypercalcemia, protein malnutrition, and debility.    PT Comments    Pt was pleasant and motivated to participate during the session and overall performed well during the session.  The pt required only minimal assistance with bed mobility along with extra time and effort and cues for sequencing.  Pt was able to come to standing with min A but presented with posterior instability initially requiring constant min A to prevent posterior LOB.  Once the pt initiated stepping forwards her stability improved and she was able to ambulate with CGA and cues for sequencing.  Pt will benefit from HHPT services and 24/7 supervision upon discharge to safely address deficits listed in patient problem list for decreased caregiver assistance and eventual return to PLOF.    Follow Up Recommendations  Home health PT;Supervision/Assistance - 24 hour     Equipment Recommendations  Rolling walker with 5" wheels;3in1 (PT)    Recommendations for Other Services       Precautions / Restrictions Precautions Precautions: Fall Restrictions Weight Bearing Restrictions: No    Mobility  Bed Mobility Overal bed mobility: Needs Assistance Bed Mobility: Supine to Sit     Supine to sit: Min assist;HOB elevated     General bed mobility comments: Min A for BLE control with pt able to manage trunk up to full sitting without assist    Transfers Overall transfer level: Needs assistance Equipment used: Rolling walker (2 wheeled) Transfers: Sit to/from Stand Sit to Stand: Min assist;From  elevated surface;+2 safety/equipment         General transfer comment: Min A to come to full upright standing and to prevent posterior LOB upon initial stand  Ambulation/Gait Ambulation/Gait assistance: Min guard Gait Distance (Feet): 5 Feet Assistive device: Rolling walker (2 wheeled) Gait Pattern/deviations: Step-through pattern;Decreased step length - right;Decreased step length - left Gait velocity: decreased   General Gait Details: Very slow cadence with short B step length but steady without LOB or adverse symptoms   Stairs             Wheelchair Mobility    Modified Rankin (Stroke Patients Only)       Balance Overall balance assessment: Needs assistance Sitting-balance support: Feet supported;Bilateral upper extremity supported Sitting balance-Leahy Scale: Fair     Standing balance support: During functional activity;Bilateral upper extremity supported Standing balance-Leahy Scale: Poor Standing balance comment: Min A to prevent posterior LOB upon initial stand                            Cognition Arousal/Alertness: Awake/alert Behavior During Therapy: WFL for tasks assessed/performed Overall Cognitive Status: History of cognitive impairments - at baseline                                 General Comments: Increased time to follow commands      Exercises      General Comments        Pertinent Vitals/Pain Pain Assessment: No/denies pain    Home Living  Prior Function            PT Goals (current goals can now be found in the care plan section) Progress towards PT goals: Progressing toward goals    Frequency    Min 2X/week      PT Plan Current plan remains appropriate    Co-evaluation              AM-PAC PT "6 Clicks" Mobility   Outcome Measure  Help needed turning from your back to your side while in a flat bed without using bedrails?: A Little Help needed moving  from lying on your back to sitting on the side of a flat bed without using bedrails?: A Little Help needed moving to and from a bed to a chair (including a wheelchair)?: A Little Help needed standing up from a chair using your arms (e.g., wheelchair or bedside chair)?: A Little Help needed to walk in hospital room?: A Little Help needed climbing 3-5 steps with a railing? : A Lot 6 Click Score: 17    End of Session Equipment Utilized During Treatment: Gait belt Activity Tolerance: Patient tolerated treatment well Patient left: in chair;with call bell/phone within reach;with chair alarm set Nurse Communication: Mobility status PT Visit Diagnosis: Difficulty in walking, not elsewhere classified (R26.2);Muscle weakness (generalized) (M62.81)     Time: [id number] PT Time Calculation (min) (ACUTE ONLY): 18 min  Charges:  $Therapeutic Activity: 8-22 mins                     D. Scott Avnoor Koury PT, DPT 04/22/20, 5:26 PM

## 2020-04-23 DIAGNOSIS — E43 Unspecified severe protein-calorie malnutrition: Secondary | ICD-10-CM | POA: Diagnosis not present

## 2020-04-23 DIAGNOSIS — R627 Adult failure to thrive: Secondary | ICD-10-CM

## 2020-04-23 NOTE — Progress Notes (Signed)
[facility]) Hospital Liaison RN note:  Visited with patient in room. She is sleeping peacefully and RN did not waken. Spoke with daughter, [name] over the phone to verify DME delivery. DME is supposed to be delivered today between 2-4pm. Per discussion with daughter, discharge will most likely be tomorrow. Hospital care team is aware.  Please send signed and completed DNR home with patient. Please provide prescriptions at discharge as needed for ongoing symptom management.  Please call with any hospice related questions or concerns.  Thank you for the opportunity to participate in this patient's care.  Zandra Abts, RN [facility] Liaison  [phone]

## 2020-04-23 NOTE — Progress Notes (Signed)
PT Cancellation Note  Patient Details Name: Sonya Craig MRN: [id number] DOB: September 16, 1927   Cancelled Treatment:    Reason Eval/Treat Not Completed: Other (comment): Per MD OT/PT orders to be completed secondary to pt having transitioned to comfort care.      Linus Salmons PT, DPT 04/23/20, 11:49 AM

## 2020-04-23 NOTE — Progress Notes (Signed)
Pt was agitated and difficult to redirect. Pt was climbing out of bed insisting that she was going into the 'next room to pick up her pocket book!' Pt became increasingly agitated and was swiping at staff to leave her alone as she was attempting to leave the bed. Pt received Lorazepam PRN with effect. On reassessment an hour later, pt appeared settled and was assisted into bed. Pt has been resting comfortably. Breathing was regular and unlabored. Staff will continue to monitor for safety.

## 2020-04-23 NOTE — Progress Notes (Addendum)
PROGRESS NOTE  Sonya Craig [id number] DOB: Dec 20, 1927 DOA: 04/17/2020 PCP: Kirk Ruths, MD  Brief History   87 year old woman multiple medical problems presented with generalized weakness and confusion with poor oral intake.  Admitted for hypercalcemia, failure to thrive, generalized weakness.  Hypercalcemia corrected with fluids, calcitonin, discontinuation of hydrochlorothiazide.  Electrolytes repleted.  Acute kidney injury improved.  However general condition did not, after further discussion with the family with palliative medicine, family elected for comfort care.  Plan for home with hospice 3/5.  A & P  Hypercalcemia on admission resolved now with aggressive IV hydration and calcitonin. --Probably secondary to hydrochlorothiazide, corrected nicely with fluids and calcitonin.  On last check was continuing to trend down.  Comfort care, no further checks.  Hypomagnesemia, hypokalemia  --repleted  Severe protein calorie malnutrition with failure to thrive, generalized weakness and weight loss --continue comfort care  AKI on CKD IIIb --renal function stable  Anemia of CKD --Hgb down probably from dilution, no further evaluation  Chronic bilateral shoulder pain --Diclofenac gel  Generalized weakness --Supportive care.Marland Kitchen  Hospital delirium --Supportive care.  Avoid medications if possible as per family request.  Use family to help redirect patient.  Fall precautions.  Disposition Plan:  Discussion: discussed w/ 2 daughters at bedside and w/ PMT prior who met w/ multiple family members. Patient and family have elected home w/ hospice. Plan home when equipment delivered.   Status is: Inpatient  Remains inpatient appropriate because:Inpatient level of care appropriate due to severity of illness   Dispo: The patient is from: Home              Anticipated d/c is to: Home              Patient currently is not medically stable to d/c.   Difficult to place patient  No  DVT prophylaxis: Place TED hose Start: 04/17/20 1618   Code Status: DNR Level of care: Med-Surg Family Communication: 2 daughters at bedside  Murray Hodgkins, MD  [facility] Direct contact: see [email or web address] (further directions at bottom of note if needed) 7PM-7AM contact night coverage as at bottom of note 04/24/2020, 8:04 AM  LOS: 6 days   Significant Hospital Events   .    Consults:  .    Procedures:  .   Significant Diagnostic Tests:  Marland Kitchen    Micro Data:  .    Antimicrobials:  .   Interval History/Subjective  CC: f/u FTT  Got confused overnight got Ativan, sleeping today.  Daughter requests no Haldol Ativan and pain medication.  Reports sister lives 8 minutes away and should be called if staff needs help redirecting patient.  Objective   Vitals:  Vitals:   04/23/20 1150 04/23/20 1932  BP: 139/71 (!) 154/79  Pulse: 62 79  Resp: 16 17  Temp: 97.6 F (36.4 C) 98 F (36.7 C)  SpO2: 98% 98%    Exam:  Constitutional:   . Appears calm and comfortable ENMT:  . grossly normal hearing  Respiratory:  . CTA bilaterally, no w/r/r.  . Respiratory effort normal.  Cardiovascular:  . RRR, no m/r/g Psychiatric:  . Mental status . sleeping   I have personally reviewed the following:   Today's Data  . No new data  Scheduled Meds: . diclofenac Sodium  4 g Topical QID   Continuous Infusions:   Principal Problem:   Hypercalcemia Active Problems:   Essential hypertension   CKD (chronic kidney disease), stage IV ([place])   At  risk for dehydration due to poor fluid intake   Protein malnutrition (HCC)   Debility   Protein-calorie malnutrition, severe   LOS: 6 days   How to contact the [facility] Attending or Consulting provider [place] or covering provider during after hours [place], for this patient?  1. Check the care team in [facility] and look for a) attending/consulting TRH provider listed and b) the [facility] team listed 2. Log into [email or web address] and use [facility]'s universal password to access. If you do not have the password, please contact the hospital operator. 3. Locate the [facility] provider you are looking for under [facility] and page to a number that you can be directly reached. 4. If you still have difficulty reaching the provider, please page the [facility] (Director on Call) for the Hospitalists listed on amion for assistance.

## 2020-04-23 NOTE — Plan of Care (Signed)

## 2020-04-24 DIAGNOSIS — E43 Unspecified severe protein-calorie malnutrition: Secondary | ICD-10-CM | POA: Diagnosis not present

## 2020-04-24 DIAGNOSIS — N179 Acute kidney failure, unspecified: Secondary | ICD-10-CM | POA: Diagnosis not present

## 2020-04-24 DIAGNOSIS — N183 Chronic kidney disease, stage 3 unspecified: Secondary | ICD-10-CM

## 2020-04-24 DIAGNOSIS — N1832 Chronic kidney disease, stage 3b: Secondary | ICD-10-CM | POA: Diagnosis not present

## 2020-04-24 NOTE — TOC Progression Note (Signed)
Transition of Care [facility]) - Progression Note    Patient Details  Name: Sonya Craig MRN: [id number] Date of Birth: 09-24-1927  Transition of Care [facility]) CM/SW [place], RN Phone Number: 04/24/2020, 12:38 PM  Clinical Narrative:   RNCM received communication from attending that he has spoken with patient's daughter and they have made the decision they don't think that they will be able to bring her home and they are interested in residential Hospice. RNCM reached out to [facility] and left message for return call. Got call back from [place] who reports that she have reached out the [facility] and someone will be calling back from there.          Expected Discharge Plan and Services                                                 Social Determinants of Health (SDOH) Interventions    Readmission Risk Interventions No flowsheet data found.

## 2020-04-24 NOTE — Progress Notes (Signed)
PROGRESS NOTE  ANALEY LATHEN [id number] DOB: 11/10/1927 DOA: 04/17/2020 PCP: Kirk Ruths, MD  Brief History   87 year old woman multiple medical problems presented with generalized weakness and confusion with poor oral intake.  Admitted for hypercalcemia, failure to thrive, generalized weakness.  Hypercalcemia corrected with fluids, calcitonin, discontinuation of hydrochlorothiazide.  Electrolytes repleted.  Acute kidney injury improved.  However general condition did not, after further discussion with the family with palliative medicine, family elected for comfort care.  Plan was for home with hospice 3/5 but daughter reported 3/5 that was unable to take home and is requesting residential hospice  A & P  Hypercalcemia on admission resolved now with aggressive IV hydration and calcitonin. --Probably secondary to hydrochlorothiazide, corrected nicely with fluids and calcitonin.  On last check was continuing to trend down.   --continue comfort care, no further checks.  Hypomagnesemia, hypokalemia  --repleted  Severe protein calorie malnutrition with failure to thrive, generalized weakness and weight loss --Not eating. Will continue comfort care  AKI on CKD IIIb --renal function stable  Anemia of CKD --Hgb down probably from dilution, no further evaluation  Chronic bilateral shoulder pain --Diclofenac gel as needed  Generalized weakness --continue supportive care.Marland Kitchen  Hospital delirium --Supportive care.  Avoid medications if possible as per family request.  Use family to help redirect patient.  Fall precautions.  Disposition Plan:  Discussion: discussed w/ daughter Clarene Critchley at bedside and second daughter. Now daughter requesting residential hospice and patient appears appropriate with minimal oral intake prognosis is grim.  Status is: Inpatient  Remains inpatient appropriate because:Inpatient level of care appropriate due to severity of illness   Dispo: The patient  is from: Home              Anticipated d/c is to: Home              Patient currently is not medically stable to d/c.   Difficult to place patient No  DVT prophylaxis: Place TED hose Start: 04/17/20 1618   Code Status: DNR Level of care: Med-Surg Family Communication: 2 daughters at bedside  Murray Hodgkins, MD  [facility] Direct contact: see [email or web address] (further directions at bottom of note if needed) 7PM-7AM contact night coverage as at bottom of note 04/24/2020, 11:04 AM  LOS: 6 days   Significant Hospital Events   .    Consults:  .    Procedures:  .   Significant Diagnostic Tests:  Marland Kitchen    Micro Data:  .    Antimicrobials:  .   Interval History/Subjective  CC: f/u FTT  Resting now Per daughters pt talking to deceased family Daughter requesting residential hospice now  Objective   Vitals:  Vitals:   04/23/20 1932 04/24/20 0841  BP: (!) 154/79 138/62  Pulse: 79 85  Resp: 17 20  Temp: 98 F (36.7 C) 98.2 F (36.8 C)  SpO2: 98% 96%    Exam:  Constitutional:   . Appears calm and comfortable, sleeping Respiratory:  . Coarse breath sounds . Respiratory effort normal.  Cardiovascular:  . distant Psychiatric:  . Mental status o Mood, affect appropriate  I have personally reviewed the following:   Today's Data  . No new data  Scheduled Meds: . diclofenac Sodium  4 g Topical QID   Continuous Infusions:   Principal Problem:   Hypercalcemia Active Problems:   Essential hypertension   At risk for dehydration due to poor fluid intake   Protein malnutrition ([place])   Debility  Protein-calorie malnutrition, severe   AKI (acute kidney injury) ([place])   CKD (chronic kidney disease), stage III ([place])   LOS: 6 days   How to contact the [facility] Attending or Consulting provider [place] or covering provider during after hours [place], for this patient?  1. Check the care team in [facility] and look for a) attending/consulting TRH provider listed and b) the  [facility] team listed 2. Log into [email or web address] and use [place]'s universal password to access. If you do not have the password, please contact the hospital operator. 3. Locate the [facility] provider you are looking for under [facility] and page to a number that you can be directly reached. 4. If you still have difficulty reaching the provider, please page the [facility] (Director on Call) for the Hospitalists listed on amion for assistance.

## 2020-04-24 NOTE — ED Provider Notes (Signed)
[facility] Emergency Department Provider Note   I have reviewed the triage vital signs and the nursing notes.   HISTORY  Chief Complaint Altered Mental Status   HPI Sonya Craig is a 87 y.o. female with a past medical history of diabetes, hypertension, hyperlipidemia, CKD, presents to the emergency department with her daughter for decreased mental status and increased somnolence.  According to the daughter over the past several days patient has been sleeping much more than normal which is atypical.  Daughter also notes that the patient's urine appears very dark.   Past Medical History:  Diagnosis Date  . Diabetes mellitus without complication ([place])   . Hypercholesterolemia   . Hypertension   . Stage 3 chronic kidney disease due to type 2 diabetes mellitus [facility])     Patient Active Problem List   Diagnosis Date Noted  . AKI (acute kidney injury) ([place]) 04/24/2020  . CKD (chronic kidney disease), stage III ([place]) 04/24/2020  . Protein-calorie malnutrition, severe 04/19/2020  . Hypercalcemia 04/17/2020  . Essential hypertension 04/17/2020  . CKD (chronic kidney disease), stage IV ([place]) 04/17/2020  . At risk for dehydration due to poor fluid intake 04/17/2020  . Protein malnutrition ([place]) 04/17/2020  . Debility 04/17/2020    History reviewed. No pertinent surgical history.  Prior to Admission medications   Medication Sig Start Date End Date Taking? Authorizing Provider  aspirin 81 MG EC tablet Take 81 mg by mouth daily at 6 (six) AM.   Yes [provider]  Diclofenac Sodium 1.5 % SOLN APPLY 40 DROPS TOPICALLY 4 TIMES A DAY TO AFFECTED AREA 02/24/20  Yes [provider]  lisinopril-hydrochlorothiazide (ZESTORETIC) 20-12.5 MG tablet Take 1 tablet by mouth 2 (two) times daily. 07/28/19  Yes [provider]  lovastatin (MEVACOR) 40 MG tablet Take 40 mg by mouth daily. 06/06/19  Yes [provider]  mirtazapine (REMERON) 7.5 MG  tablet Take 7.5 mg by mouth at bedtime. 06/06/19  Yes [provider]  amitriptyline (ELAVIL) 10 MG tablet Take 10 mg by mouth at bedtime. Patient not taking: No sig reported 06/30/19   [provider]    No Known Allergies  History reviewed. No pertinent family history.  Social History Social History   Tobacco Use  . Smoking status: Never Smoker  . Smokeless tobacco: Never Used  Substance Use Topics  . Alcohol use: Yes    Comment: occasionally  . Drug use: Never    Review of Systems Per patient and daughter Constitutional: Negative for fever Cardiovascular: Negative for chest pain. Respiratory: Negative for shortness of breath. Gastrointestinal: Negative for abdominal pain Genitourinary: Daughter reports dark urine Neurological: Negative for headache All other ROS negative  ____________________________________________   PHYSICAL EXAM:  VITAL SIGNS: ED Triage Vitals  Enc Vitals Group     BP 04/17/20 1201 (!) 165/75     Pulse Rate 04/17/20 1201 72     Resp 04/17/20 1201 18     Temp 04/17/20 1201 97.8 F (36.6 C)     Temp Source 04/17/20 1201 Oral     SpO2 04/17/20 1201 99 %     Weight 04/17/20 1202 110 lb (49.9 kg)     Height 04/17/20 1202 '5\' 4"'$  (1.626 m)     Head Circumference --      Peak Flow --      Pain Score 04/17/20 1201 0     Pain Loc --      Pain Edu? --  Excl. in [place]? --     Constitutional: Somnolent but does awaken to voice and answers questions but falls back asleep if not actively engaged Eyes: Normal exam ENT      Head: Normocephalic and atraumatic.      Mouth/Throat: Mucous membranes are moist. Cardiovascular: Normal rate, regular rhythm.  Respiratory: Normal respiratory effort without tachypnea nor retractions. Breath sounds are clear  Gastrointestinal: Soft and nontender. No distention. Musculoskeletal: Nontender with normal range of motion in all extremities. Neurologic:  Normal speech and language.  Skin:  Skin is  warm, dry and intact.  Psychiatric: Mood and affect are normal.   ____________________________________________     RADIOLOGY  CT head is negative for acute abnormality  ____________________________________________   INITIAL IMPRESSION / ASSESSMENT AND PLAN / ED COURSE  Pertinent labs & imaging results that were available during my care of the patient were reviewed by me and considered in my medical decision making (see chart for details).   Patient presents emergency department with daughter for increased somnolence.  Patient's work-up shows hypercalcemia which could be contributing to the patient's altered mental state.  Remainder the lab work is largely unchanged from baseline.  Covid is negative.  Given the patient's hypercalcemia we will begin IV hydration and admit to the hospital service for further work-up and treatment.  Daughter agreeable.  Sonya Craig was evaluated in Emergency Department on 04/24/2020 for the symptoms described in the history of present illness. She was evaluated in the context of the global COVID-19 pandemic, which necessitated consideration that the patient might be at risk for infection with the SARS-CoV-2 virus that causes COVID-19. Institutional protocols and algorithms that pertain to the evaluation of patients at risk for COVID-19 are in a state of rapid change based on information released by regulatory bodies including the CDC and federal and state organizations. These policies and algorithms were followed during the patient's care in the ED.  ____________________________________________   FINAL CLINICAL IMPRESSION(S) / ED DIAGNOSES  Hypercalcemia Altered mental status   Harvest Dark, MD 04/24/20 2253

## 2020-04-25 DIAGNOSIS — E43 Unspecified severe protein-calorie malnutrition: Secondary | ICD-10-CM | POA: Diagnosis not present

## 2020-04-25 LAB — PTH-RELATED PEPTIDE: PTH-related peptide: <2 pmol/L

## 2020-04-25 NOTE — Progress Notes (Signed)
PROGRESS NOTE  Sonya Craig [id number] DOB: 23-Sep-1927 DOA: 04/17/2020 PCP: Kirk Ruths, MD  Brief History   87 year old woman multiple medical problems presented with generalized weakness and confusion with poor oral intake.  Admitted for hypercalcemia, failure to thrive, generalized weakness.  Hypercalcemia corrected with fluids, calcitonin, discontinuation of hydrochlorothiazide.  Electrolytes repleted.  Acute kidney injury improved.  However general condition did not, after further discussion with the family with palliative medicine, family elected for comfort care.  Plan was for home with hospice 3/5 but daughter reported 3/5 that was unable to take home and is requesting residential hospice. To residential hospice when bed available.  A & P  Hypercalcemia on admission resolved now with aggressive IV hydration and calcitonin. --Probably secondary to hydrochlorothiazide, corrected nicely with fluids and calcitonin.  On last check was continuing to trend down.   --continue comfort care, no further checks.  Hypomagnesemia, hypokalemia  --repleted  Severe protein calorie malnutrition with failure to thrive, generalized weakness and weight loss --Not eating well. Will continue comfort care  AKI on CKD IIIb --renal function stable on last check  Anemia of CKD --Hgb down probably from dilution, no further evaluation  Chronic bilateral shoulder pain --Diclofenac gel stopped per family request  Generalized weakness --continue supportive care.Marland Kitchen  Hospital delirium --Supportive care.  Avoid medications if possible as per family request.  Use family to help redirect patient.  Fall precautions.  Disposition Plan:  Discussion: stable for transfer to residential hopsice when bed available  Status is: Inpatient  Remains inpatient appropriate because:Inpatient level of care appropriate due to severity of illness   Dispo: The patient is from: Home              Anticipated  d/c is to: Residential hospice              Patient currently is medically stable to d/c.   Difficult to place patient No  DVT prophylaxis: Place TED hose Start: 04/17/20 1618   Code Status: DNR Level of care: Med-Surg Family Communication: 2 daughters at bedside  Murray Hodgkins, MD  [facility] Direct contact: see [email or web address] (further directions at bottom of note if needed) 7PM-7AM contact night coverage as at bottom of note 04/25/2020, 1:42 PM  LOS: 7 days   Significant Hospital Events   .    Consults:  .    Procedures:  .   Significant Diagnostic Tests:  Marland Kitchen    Micro Data:  .    Antimicrobials:  .   Interval History/Subjective  CC: f/u FTT  Awake today, no complaints 3 daughters at bedside  Objective   Vitals:  Vitals:   04/24/20 0841 04/25/20 0836  BP: 138/62 140/61  Pulse: 85 74  Resp: 20 16  Temp: 98.2 F (36.8 C) 99 F (37.2 C)  SpO2: 96% 98%    Exam:  Constitutional:   . Appears calm and comfortable ENMT:  . grossly normal hearing  Respiratory:  . CTA bilaterally, no w/r/r.  . Respiratory effort normal.  Cardiovascular:  . RRR, no m/r/g . No LE extremity edema   Psychiatric:  . Mental status o Mood, affect appropriate  I have personally reviewed the following:   Today's Data  . No new data  Scheduled Meds:  Continuous Infusions:   Principal Problem:   Hypercalcemia Active Problems:   Essential hypertension   At risk for dehydration due to poor fluid intake   Protein malnutrition (HCC)   Debility   Protein-calorie malnutrition, severe  AKI (acute kidney injury) ([place])   CKD (chronic kidney disease), stage III ([place])   LOS: 7 days   How to contact the [facility] Attending or Consulting provider [place] or covering provider during after hours [place], for this patient?  1. Check the care team in [facility] and look for a) attending/consulting TRH provider listed and b) the [facility] team listed 2. Log into [email or web address] and use [facility]'s universal password to access. If you do not have the password, please contact the hospital operator. 3. Locate the [facility] provider you are looking for under [facility] and page to a number that you can be directly reached. 4. If you still have difficulty reaching the provider, please page the [facility] (Director on Call) for the Hospitalists listed on amion for assistance.

## 2020-04-26 DIAGNOSIS — E43 Unspecified severe protein-calorie malnutrition: Secondary | ICD-10-CM | POA: Diagnosis not present

## 2020-04-26 DIAGNOSIS — R627 Adult failure to thrive: Secondary | ICD-10-CM | POA: Diagnosis not present

## 2020-04-26 MED ORDER — ENSURE ENLIVE PO LIQD
237.0000 mL | Freq: Two times a day (BID) | ORAL | Status: DC
  Administered 2020-04-26 – 2020-04-27 (×3): 237 mL via ORAL

## 2020-04-26 NOTE — Care Management Important Message (Signed)
Important Message  Patient Details  Name: DELVA LLERA MRN: [id number] Date of Birth: [birth year]   Medicare Important Message Given:  Other (see comment)  Patient is on comfort care and out of respect for the patient and family no Important Message from [facility] given.  Juliann Pulse A Shanteria Laye 04/26/2020, 9:58 AM

## 2020-04-26 NOTE — Progress Notes (Signed)
[facility]) Hospital Liaison RN note:  Visited with patient in room. She is resting quietly in bed. Alert and oriented. Denies any complaints. Discussed with daughter, [name] in person that her mother was not approved for the hospice home but was certainly still hospice appropriate if she wished to take her home. She is going to discuss with the rest of the family and make a decision regarding home with hospice vs home health. Patient still has DME in home. [place] Liaison will follow up tomorrow for decision.   Please call with any hospice related questions or concerns.  Thank you for the opportunity to participate in this patient's care.  Zandra Abts, RN [facility] Liaison  [phone]

## 2020-04-26 NOTE — Progress Notes (Signed)
Nutrition Follow-up  DOCUMENTATION CODES:   Severe malnutrition in context of chronic illness,Underweight  INTERVENTION:  Continue Magic cup TID with meals, each supplement provides 290 kcal and 9 grams of protein  Ensure Enlive po BID, each supplement provides 350 kcal and 20 grams of protein (pt enjoys drinking)   NUTRITION DIAGNOSIS:   Severe Malnutrition related to chronic illness (CKD, advanced age) as evidenced by severe fat depletion,severe muscle depletion. -ongoing  GOAL:   Patient will meet greater than or equal to 90% of their needs  -unmet   MONITOR:   PO intake,Supplement acceptance,Labs,Weight trends,I & O's  REASON FOR ASSESSMENT:   Consult Assessment of nutrition requirement/status  ASSESSMENT:   87 year old female with PMHx of DM, HTN, CKD stage IV admitted with hypercalcemia, generalized weakness, severe malnutrition.  Pt has transitioned to comfort care and is pending discharge to residential hospice when bed available.   Pt awake, watching television this morning. She reports doing well today, says her appetite has been good and recalls eating almost all of her eggs, bacon, and ice cream this morning. Pt previously receiving Ensure supplement BID, discontinued on 3/4 with comfort measures. She reports liking them and enjoyed chocolate flavor, will re-order for pt.  I/Os: +4289 ml since admit  Medications reviewed   No new labs for review  Diet Order:   Diet Order            Diet regular Room service appropriate? Yes; Fluid consistency: Thin  Diet effective now                 EDUCATION NEEDS:   Education needs have been addressed  Skin:  Skin Assessment: Reviewed RN Assessment  Last BM:  3/6 type 6; brown medium  Height:   Ht Readings from Last 1 Encounters:  04/17/20 '5\' 4"'$  (1.626 m)    Weight:   Wt Readings from Last 1 Encounters:  04/19/20 42.6 kg    BMI:  Body mass index is 16.12 kg/m.  Estimated Nutritional Needs:    Kcal:  1200-1400  Protein:  60-70 grams  Fluid:  1.2-1.4 L/day   Lajuan Lines, RD, LDN Clinical Nutrition After Hours/Weekend Pager # in [place]

## 2020-04-26 NOTE — Progress Notes (Signed)
PROGRESS NOTE  Sonya Craig [id number] DOB: [birth year] DOA: 04/17/2020 PCP: Kirk Ruths, MD  Brief History   87 year old woman multiple medical problems presented with generalized weakness and confusion with poor oral intake.  Admitted for hypercalcemia, failure to thrive, generalized weakness.  Hypercalcemia corrected with fluids, calcitonin, discontinuation of hydrochlorothiazide.  Electrolytes repleted.  Acute kidney injury improved.  However general condition did not, after further discussion with the family with palliative medicine, family elected for comfort care.  Plan was for home with hospice 3/5 but daughter reported 3/5 that was unable to take home and  requested residential hospice. However residential hospice turned patient down 3/7. Now exploring home vs SNF.  A & P  Hypercalcemia on admission resolved now with aggressive IV hydration and calcitonin. --Probably secondary to hydrochlorothiazide, corrected nicely with fluids and calcitonin.  On last check was continuing to trend down.   --continue comfort care, no further checks.  Hypomagnesemia, hypokalemia  --repleted  Severe protein calorie malnutrition with failure to thrive, generalized weakness and weight loss --Will continue comfort care  AKI on CKD IIIb --renal function stable on last check  Anemia of CKD --Hgb down probably from dilution, no further evaluation  Chronic bilateral shoulder pain --Diclofenac gel stopped per family request  Generalized weakness --continue supportive care.Marland Kitchen  Hospital delirium --Supportive care.  Avoid medications if possible as per family request.  Use family to help redirect patient.  Fall precautions.  Disposition Plan:  Discussion: stable for transfer to home or SNF, TOC and family exploring.  Status is: Inpatient  Remains inpatient appropriate because:Inpatient level of care appropriate due to severity of illness   Dispo: The patient is from: Home               Anticipated d/c is to: Residential hospice              Patient currently is medically stable to d/c.   Difficult to place patient No  DVT prophylaxis: Place TED hose Start: 04/17/20 1618   Code Status: DNR Level of care: Med-Surg Family Communication: daughter RN on unit  Murray Hodgkins, MD  [facility] Direct contact: see [email or web address] (further directions at bottom of note if needed) 7PM-7AM contact night coverage as at bottom of note 04/26/2020, 4:16 PM  LOS: 8 days   Significant Hospital Events   .    Consults:  .    Procedures:  .   Significant Diagnostic Tests:  Marland Kitchen    Micro Data:  .    Antimicrobials:  .   Interval History/Subjective  CC: f/u FTT  Feels ok today  Objective   Vitals:  Vitals:   04/26/20 0531 04/26/20 0759  BP: (!) 158/65 (!) 154/67  Pulse: 64 77  Resp: 15 16  Temp: 98.1 F (36.7 C) 98.2 F (36.8 C)  SpO2: 96% 97%    Exam:  Constitutional:   . Appears calm and comfortable ENMT:  . grossly normal hearing  Respiratory:  . CTA bilaterally, no w/r/r.  . Respiratory effort normal.  Cardiovascular:  . RRR, no m/r/g . No LE extremity edema   Psychiatric:  . Mental status o Mood, affect appropriate  I have personally reviewed the following:   Today's Data  . No new data  Scheduled Meds: . feeding supplement  237 mL Oral BID BM   Continuous Infusions:   Principal Problem:   Hypercalcemia Active Problems:   Essential hypertension   At risk for dehydration due to poor fluid intake  Protein malnutrition (HCC)   Debility   Protein-calorie malnutrition, severe   AKI (acute kidney injury) ([place])   CKD (chronic kidney disease), stage III ([place])   LOS: 8 days   How to contact the [facility] Attending or Consulting provider [place] or covering provider during after hours [place], for this patient?  1. Check the care team in [facility] and look for a) attending/consulting TRH provider listed and b) the [facility] team listed 2. Log into  [email or web address] and use [place]'s universal password to access. If you do not have the password, please contact the hospital operator. 3. Locate the [facility] provider you are looking for under [facility] and page to a number that you can be directly reached. 4. If you still have difficulty reaching the provider, please page the [facility] (Director on Call) for the Hospitalists listed on amion for assistance.

## 2020-04-27 DIAGNOSIS — R410 Disorientation, unspecified: Secondary | ICD-10-CM

## 2020-04-27 HISTORY — DX: Disorientation, unspecified: R41.0

## 2020-04-27 NOTE — Progress Notes (Addendum)
PROGRESS NOTE  Sonya Craig [id number] DOB: [birth year] DOA: 04/17/2020 PCP: Kirk Ruths, MD  Brief History   87 year old woman multiple medical problems presented with generalized weakness and confusion with poor oral intake.  Admitted for hypercalcemia, failure to thrive, generalized weakness.  Hypercalcemia corrected with fluids, calcitonin, discontinuation of hydrochlorothiazide.  Electrolytes repleted.  Acute kidney injury improved.  However general condition did not, after further discussion with the family with palliative medicine, family elected for comfort care.  Plan was for home with hospice 3/5 but daughter reported 3/5 that was unable to take home and  requested residential hospice. However residential hospice turned patient down 3/7. Now exploring home vs SNF.  Medically stable for discharge.  A & P  Hypercalcemia on admission resolved now with aggressive IV hydration and calcitonin. --Probably secondary to hydrochlorothiazide, corrected nicely with fluids and calcitonin.  On last check was continuing to trend down.   --continue comfort care, no further checks.  Hypomagnesemia, hypokalemia  --repleted  Severe protein calorie malnutrition with failure to thrive, generalized weakness and weight loss --Will continue comfort care  AKI on CKD IIIb --renal function stable on last check  Anemia of CKD --Hgb down probably from dilution, no further evaluation  Chronic bilateral shoulder pain --Diclofenac gel stopped per family request  Generalized weakness --continue supportive care.Marland Kitchen  Hospital delirium --Supportive care.  Avoid medications if possible as per family request.  Use family to help redirect patient.  Fall precautions.  Disposition Plan:  Discussion: stable for transfer to SNF, TOC and family exploring.  Status is: Inpatient  Remains inpatient appropriate because:Inpatient level of care appropriate due to severity of illness   Dispo: The patient  is from: Home              Anticipated d/c is to: SNF              Patient currently is medically stable to d/c.   Difficult to place patient No  DVT prophylaxis: Place TED hose Start: 04/17/20 1618   Code Status: DNR Level of care: Med-Surg Family Communication: daughter at bedside  Murray Hodgkins, MD  [facility] Direct contact: see [email or web address] (further directions at bottom of note if needed) 7PM-7AM contact night coverage as at bottom of note 04/27/2020, 4:39 PM  LOS: 9 days    Interval History/Subjective  CC: f/u FTT  Feels good today Daughter at bedside, pt still confused, ate yesterday but not much today  Objective   Vitals:  Vitals:   04/27/20 1125 04/27/20 1523  BP: (!) 175/83 (!) 173/79  Pulse: 75 74  Resp: 18 17  Temp: (!) 97.3 F (36.3 C) 98.2 F (36.8 C)  SpO2: 100% 98%    Exam:  Constitutional:   . Appears calm and comfortable, better today, more alert ENMT:  . grossly normal hearing  Respiratory:  . CTA bilaterally, no w/r/r.  . Respiratory effort normal.  Cardiovascular:  . RRR, no m/r/g Psychiatric:  . Mental status o Mood, affect appropriate  I have personally reviewed the following:   Today's Data  . No new data  Scheduled Meds: . feeding supplement  237 mL Oral BID BM   Continuous Infusions:   Principal Problem:   Hypercalcemia Active Problems:   Essential hypertension   At risk for dehydration due to poor fluid intake   Protein malnutrition (HCC)   Debility   Protein-calorie malnutrition, severe   AKI (acute kidney injury) ([place])   CKD (chronic kidney disease), stage III ([place])  LOS: 9 days   How to contact the [facility] Attending or Consulting provider [place] or covering provider during after hours [place], for this patient?  1. Check the care team in [facility] and look for a) attending/consulting TRH provider listed and b) the [facility] team listed 2. Log into [email or web address] and use [place]'s universal password to access. If you  do not have the password, please contact the hospital operator. 3. Locate the [facility] provider you are looking for under [facility] and page to a number that you can be directly reached. 4. If you still have difficulty reaching the provider, please page the [facility] (Director on Call) for the Hospitalists listed on amion for assistance.

## 2020-04-27 NOTE — TOC Progression Note (Signed)
Transition of Care [facility]) - Progression Note    Patient Details  Name: AKHIA CARRAS MRN: [id number] Date of Birth: 06-Sep-1927  Transition of Care [facility]) CM/SW [place], RN Phone Number: 04/27/2020, 11:14 AM  Clinical Narrative:   Family interested in SNF placement, patient pending PT eval.          Expected Discharge Plan and Services                                                 Social Determinants of Health (SDOH) Interventions    Readmission Risk Interventions No flowsheet data found.

## 2020-04-27 NOTE — NC FL2 (Signed)
[place] LEVEL OF CARE SCREENING TOOL     IDENTIFICATION  Patient Name: Sonya Craig Birthdate: 12-22-1927 Sex: female Admission Date (Current Location): 04/17/2020  [place] and [place] Number:  Engineering geologist and Address:  [facility], [address] [place], [place] [address]      Provider Number: [id number]  Attending Physician Name and Address:  Samuella Cota, MD  Relative Name and Phone Number:  Derrek Monaco [phone]    Current Level of Care: Hospital Recommended Level of Care: [place] Prior Approval Number:    Date Approved/Denied:   PASRR Number: [id number]  Discharge Plan: SNF    Current Diagnoses: Patient Active Problem List   Diagnosis Date Noted  . AKI (acute kidney injury) ([place]) 04/24/2020  . CKD (chronic kidney disease), stage III ([place]) 04/24/2020  . Protein-calorie malnutrition, severe 04/19/2020  . Hypercalcemia 04/17/2020  . Essential hypertension 04/17/2020  . CKD (chronic kidney disease), stage IV ([place]) 04/17/2020  . At risk for dehydration due to poor fluid intake 04/17/2020  . Protein malnutrition ([place]) 04/17/2020  . Debility 04/17/2020    Orientation RESPIRATION BLADDER Height & Weight     Self,Time,Place  Normal External catheter Weight: 42.6 kg (bed scale) Height:  '5\' 4"'$  (162.6 cm)  BEHAVIORAL SYMPTOMS/MOOD NEUROLOGICAL BOWEL NUTRITION STATUS      Continent Diet (Regular)  AMBULATORY STATUS COMMUNICATION OF NEEDS Skin   Limited Assist Verbally Normal                       Personal Care Assistance Level of Assistance  Bathing,Feeding,Dressing Bathing Assistance: Maximum assistance Feeding assistance: Limited assistance Dressing Assistance: Maximum assistance     Functional Limitations Info  Sight,Hearing,Speech Sight Info: Adequate Hearing Info: Adequate Speech Info: Adequate    SPECIAL CARE FACTORS FREQUENCY  PT (By licensed PT),OT (By  licensed OT)                    Contractures Contractures Info: Not present    Additional Factors Info  Code Status,Allergies Code Status Info: DNR Allergies Info: No known allergies           Current Medications (04/27/2020):  This is the current hospital active medication list Current Facility-Administered Medications  Medication Dose Route Frequency Provider Last Rate Last Admin  . acetaminophen (TYLENOL) tablet 650 mg  650 mg Oral Q6H PRN Asencion Gowda, NP       Or  . acetaminophen (TYLENOL) suppository 650 mg  650 mg Rectal Q6H PRN Asencion Gowda, NP      . antiseptic oral rinse (BIOTENE) solution 15 mL  15 mL Topical PRN Griffin, Crystal, NP      . feeding supplement (ENSURE ENLIVE / ENSURE PLUS) liquid 237 mL  237 mL Oral BID BM Samuella Cota, MD   237 mL at 04/27/20 0843  . glycopyrrolate (ROBINUL) tablet 1 mg  1 mg Oral Q4H PRN Asencion Gowda, NP       Or  . glycopyrrolate (ROBINUL) injection 0.2 mg  0.2 mg Subcutaneous Q4H PRN Asencion Gowda, NP       Or  . glycopyrrolate (ROBINUL) injection 0.2 mg  0.2 mg Intravenous Q4H PRN Asencion Gowda, NP      . ondansetron (ZOFRAN-ODT) disintegrating tablet 4 mg  4 mg Oral Q6H PRN Asencion Gowda, NP       Or  . ondansetron (ZOFRAN) injection 4 mg  4 mg Intravenous Q6H PRN  Laurann Montana, Crystal, NP      . polyvinyl alcohol (LIQUIFILM TEARS) 1.4 % ophthalmic solution 1 drop  1 drop Both Eyes QID PRN Asencion Gowda, NP         Discharge Medications: Please see discharge summary for a list of discharge medications.  Relevant Imaging Results:  Relevant Lab Results:   Additional Information SS# [id number]  Shelbie Ammons, RN

## 2020-04-27 NOTE — Plan of Care (Signed)
  Problem: Health Behavior/Discharge Planning: Goal: Ability to manage health-related needs will improve Outcome: Progressing   

## 2020-04-27 NOTE — Progress Notes (Signed)
[name]) Hospital Liaison RN note:  Per discussion with Jhonnie Garner, TOC and daughter, Sonya Craig, plan is to go to SNF for rehab. Referral has been notified to close referral and [facility] will contact Sonya Craig to arrange a time to remove DME from home. [place] Hospital Liaison will remain available to assist with future disposition needs as needed.  Thank you for the opportunity to participate in this patient's care.  Zandra Abts, RN [facility] Liaison [phone]

## 2020-04-27 NOTE — Evaluation (Signed)
Physical Therapy Re-Evaluation Patient Details Name: Sonya Craig MRN: [id number] DOB: 01-04-1928 Today's Date: 04/27/2020   History of Present Illness  Pt is a 87 y.o. female with medical history significant for hypertension, chronic debility and weakness, CKD 4, non-insulin-dependent diabetes mellitus, presented to the ED on 04/18/20 for chief concerns of generalized weakness and confusion since 2/24.  MD assessment includes: hypercalcemia, protein malnutrition, and debility.    Clinical Impression  Pt was pleasant and motivated to participate during the session and put forth good effort throughout.  Pt with noted decrease in functional strength compared to prior evaluation as well as decreased standing balance.  Pt required substantial assistance to come to standing and once in standing required constant mod A to prevent posterior LOB.  Pt is at a very high risk for falls and would not be safe to return to her prior living situation at this time.  Prior goals remain appropriate.  Pt will benefit from PT services in a SNF setting upon discharge to safely address deficits listed in patient problem list for decreased caregiver assistance and eventual return to PLOF.      Follow Up Recommendations SNF;Supervision/Assistance - 24 hour    Equipment Recommendations  Rolling walker with 5" wheels;3in1 (PT)    Recommendations for Other Services       Precautions / Restrictions Precautions Precautions: Fall Restrictions Weight Bearing Restrictions: No      Mobility  Bed Mobility Overal bed mobility: Needs Assistance Bed Mobility: Supine to Sit;Sit to Supine     Supine to sit: Mod assist Sit to supine: Mod assist   General bed mobility comments: Mod A for BLE and trunk control    Transfers Overall transfer level: Needs assistance Equipment used: Rolling walker (2 wheeled) Transfers: Sit to/from Stand Sit to Stand: Mod assist         General transfer comment: Mod A to come to  full upright standing and to prevent posterior LOB while in standing  Ambulation/Gait Ambulation/Gait assistance: Mod assist Gait Distance (Feet): 2 Feet Assistive device: Rolling walker (2 wheeled) Gait Pattern/deviations: Step-through pattern;Decreased step length - right;Decreased step length - left;Trunk flexed Gait velocity: decreased   General Gait Details: Pt able to take several very small steps at the EOB before returning to sitting with constant Mod A to prevent posterior LOB  Stairs            Wheelchair Mobility    Modified Rankin (Stroke Patients Only)       Balance Overall balance assessment: Needs assistance Sitting-balance support: Feet supported;Bilateral upper extremity supported Sitting balance-Leahy Scale: Fair     Standing balance support: During functional activity;Bilateral upper extremity supported Standing balance-Leahy Scale: Poor Standing balance comment: Mod A to prevent posterior LOB while in standing                             Pertinent Vitals/Pain Pain Assessment: No/denies pain    Home Living Family/patient expects to be discharged to:: Private residence Living Arrangements: Children Available Help at Discharge: Family;Available 24 hours/day Type of Home: House Home Access: Stairs to enter Entrance Stairs-Rails: None Entrance Stairs-Number of Steps: 3 Home Layout: One level Home Equipment: Walker - 4 wheels;Shower seat Additional Comments: History from pt's daughter; family availble for 24/7 assistance    Prior Function Level of Independence: Needs assistance   Gait / Transfers Assistance Needed: Mod Ind amb in the home with a rollator  ADL's /  Homemaking Assistance Needed: Some assist from family with ADLs        Hand Dominance   Dominant Hand: Right    Extremity/Trunk Assessment   Upper Extremity Assessment Upper Extremity Assessment: Generalized weakness    Lower Extremity Assessment Lower Extremity  Assessment: Generalized weakness       Communication   Communication: No difficulties  Cognition Arousal/Alertness: Awake/alert Behavior During Therapy: WFL for tasks assessed/performed Overall Cognitive Status: History of cognitive impairments - at baseline                                        General Comments      Exercises Total Joint Exercises Ankle Circles/Pumps: AROM;Strengthening;Both;10 reps Towel Squeeze: Strengthening;Both;10 reps Heel Slides: AROM;AAROM;Strengthening;Both;10 reps Long Arc Quad: AROM;Strengthening;Both;10 reps Knee Flexion: AROM;Strengthening;Both;10 reps Marching in Standing: AROM;Strengthening;Both;10 reps;Seated Other Exercises Other Exercises: Anterior weight shifting in standing to address posterior instability   Assessment/Plan    PT Assessment Patient needs continued PT services  PT Problem List Decreased strength;Decreased activity tolerance;Decreased balance;Decreased mobility;Decreased knowledge of use of DME       PT Treatment Interventions DME instruction;Gait training;Stair training;Functional mobility training;Therapeutic activities;Therapeutic exercise;Balance training;Patient/family education    PT Goals (Current goals can be found in the Care Plan section)  Acute Rehab PT Goals Patient Stated Goal: To return home PT Goal Formulation: With patient Time For Goal Achievement: 05/10/20 Potential to Achieve Goals: Fair    Frequency Min 2X/week   Barriers to discharge Inaccessible home environment      Co-evaluation               AM-PAC PT "6 Clicks" Mobility  Outcome Measure Help needed turning from your back to your side while in a flat bed without using bedrails?: A Lot Help needed moving from lying on your back to sitting on the side of a flat bed without using bedrails?: A Lot Help needed moving to and from a bed to a chair (including a wheelchair)?: A Lot Help needed standing up from a chair  using your arms (e.g., wheelchair or bedside chair)?: A Lot Help needed to walk in hospital room?: Total Help needed climbing 3-5 steps with a railing? : Total 6 Click Score: 10    End of Session Equipment Utilized During Treatment: Gait belt Activity Tolerance: Patient tolerated treatment well Patient left: in bed;with call bell/phone within reach;with bed alarm set;with family/visitor present Nurse Communication: Mobility status PT Visit Diagnosis: Difficulty in walking, not elsewhere classified (R26.2);Muscle weakness (generalized) (M62.81)    Time: [id number] PT Time Calculation (min) (ACUTE ONLY): 25 min   Charges:   PT Evaluation $PT Re-evaluation: 1 Re-eval PT Treatments $Therapeutic Exercise: 8-22 mins        D. Royetta Asal PT, DPT 04/27/20, 5:06 PM

## 2020-04-28 LAB — RESP PANEL BY RT-PCR (FLU A&B, COVID) ARPGX2
Influenza A by PCR: NEGATIVE
Influenza B by PCR: NEGATIVE
SARS Coronavirus 2 by RT PCR: NEGATIVE

## 2020-04-28 MED ORDER — ENSURE ENLIVE PO LIQD: 237.0000 mL | Freq: Two times a day (BID) | ORAL | 12 refills | Status: DC

## 2020-04-28 MED ORDER — LISINOPRIL 20 MG PO TABS: 20.0000 mg | ORAL_TABLET | Freq: Every day | ORAL | Status: DC

## 2020-04-28 MED ORDER — ACETAMINOPHEN 325 MG PO TABS: 650.0000 mg | ORAL_TABLET | Freq: Four times a day (QID) | ORAL | Status: DC | PRN

## 2020-04-28 MED ORDER — ONDANSETRON 4 MG PO TBDP: 4.0000 mg | ORAL_TABLET | Freq: Four times a day (QID) | ORAL | 0 refills | Status: DC | PRN

## 2020-04-28 NOTE — Progress Notes (Signed)
[facility]) Hospital Liaison note:  This patient is newly referred to [facility] outpatient-based palliative care.  Will continue to follow for disposition.  Please call for any outpatient palliative care questions or concerns.  Thank you, Lorelee Market, LPN [facility] Liaison [phone]

## 2020-04-28 NOTE — Discharge Summary (Signed)
Physician Discharge Summary  Sonya Craig [id number] DOB: 04-16-1927 DOA: 04/17/2020  PCP: Kirk Ruths, MD  Admit date: 04/17/2020 Discharge date: 04/28/2020  Admitted From: Home Disposition:SNF     Recommendations for Outpatient Follow-up:  1. Follow up with PCP in 1-2 weeks 2. Follow-up with outpatient palliative care 3. Please obtain BMP/CBC in one week 4. Please follow up on the following pending results:None  Home Health: No Equipment/Devices: Rolling walker Discharge Condition: Stable CODE STATUS: DNR Diet recommendation: Heart Healthy   Brief/Interim Summary: Sonya Craig is a 87 y.o. female with medical history significant for hypertension, chronic debility and weakness, CKD 4, non-insulin-dependent diabetes mellitus, presents to the emergency department for chief concerns of generalized weakness and confusion. Admitted for hypercalcemia, failure to thrive, generalized weakness.  Hypercalcemia corrected with fluids, calcitonin, discontinuation of hydrochlorothiazide.  Electrolytes repleted.  Acute kidney injury improved.  However general condition did not, after further discussion with the family with palliative medicine, family elected for comfort care.  Plan was for home with hospice 3/5 but daughter reported 3/5 that was unable to take home and  requested residential hospice. However residential hospice turned patient down 3/7, as patient started eating and drinking.  Later family decided to take her to SNF.  She will follow up with [facility] care palliative care as an outpatient.  Her home dose of HCTZ was discontinued due to hypercalcemia.  She will resume the lisinopril along with aspirin and statin on discharge.  Patient will remain high risk for deterioration and death.  Discharge Diagnoses:  Principal Problem:   Hypercalcemia Active Problems:   Essential hypertension   At risk for dehydration due to poor fluid intake   Protein malnutrition (HCC)    Debility   Protein-calorie malnutrition, severe   AKI (acute kidney injury) ([place])   CKD (chronic kidney disease), stage III [facility])   Delirium   Discharge Instructions  Discharge Instructions    Amb Referral to Palliative Care   Complete by: As directed    Diet - low sodium heart healthy   Complete by: As directed    Increase activity slowly   Complete by: As directed      Allergies as of 04/28/2020   No Known Allergies     Medication List    STOP taking these medications   amitriptyline 10 MG tablet Commonly known as: ELAVIL   lisinopril-hydrochlorothiazide 20-12.5 MG tablet Commonly known as: ZESTORETIC     TAKE these medications   acetaminophen 325 MG tablet Commonly known as: TYLENOL Take 2 tablets (650 mg total) by mouth every 6 (six) hours as needed for mild pain (or Fever >/= 101).   aspirin 81 MG EC tablet Take 81 mg by mouth daily at 6 (six) AM.   Diclofenac Sodium 1.5 % Soln APPLY 40 DROPS TOPICALLY 4 TIMES A DAY TO AFFECTED AREA   feeding supplement Liqd Take 237 mLs by mouth 2 (two) times daily between meals.   lisinopril 20 MG tablet Commonly known as: ZESTRIL Take 1 tablet (20 mg total) by mouth daily.   lovastatin 40 MG tablet Commonly known as: MEVACOR Take 40 mg by mouth daily.   mirtazapine 7.5 MG tablet Commonly known as: REMERON Take 7.5 mg by mouth at bedtime.   ondansetron 4 MG disintegrating tablet Commonly known as: ZOFRAN-ODT Take 1 tablet (4 mg total) by mouth every 6 (six) hours as needed for nausea.            Durable Medical Equipment  (  From admission, onward)         Start     Ordered   04/20/20 0919  For home use only DME 3 n 1  Once        04/20/20 0919   04/20/20 0919  For home use only DME Walker rolling  Once       Question Answer Comment  Walker: With [place] Wheels   Patient needs a walker to treat with the following condition Weakness      04/20/20 0919          Contact information for follow-up  providers    Kirk Ruths, MD. Schedule an appointment as soon as possible for a visit.   Specialty: Internal Medicine Contact information: [place] [address] [phone]            Contact information for after-discharge care    Destination    [facility] .   Service: Skilled Nursing Contact information: [place] [phone]                 No Known Allergies  Consultations:  Palliative care  Procedures/Studies: CT HEAD WO CONTRAST  Result Date: 04/21/2020 CLINICAL DATA:  Trauma, scalp hematoma EXAM: CT HEAD WITHOUT CONTRAST TECHNIQUE: Contiguous axial images were obtained from the base of the skull through the vertex without intravenous contrast. COMPARISON:  04/17/2020, 08/25/2019 FINDINGS: Brain: Normal anatomic configuration. Parenchymal volume loss is commensurate with the patient's age. periventricular white matter changes are present likely reflecting the sequela of small vessel ischemia. No abnormal intra or extra-axial mass lesion or fluid collection. No abnormal mass effect or midline shift. No evidence of acute intracranial hemorrhage or infarct. Ventricular size is normal. Cerebellum unremarkable. Vascular: No asymmetric hyperdense vasculature at the skull base. Skull: Intact. There is expansion of the right lateral wall of the orbit and right frontal bone in keeping with changes of a focal fibrous dysplasia. Sinuses/Orbits: Paranasal sinuses are clear. Orbits are unremarkable. Other: Mastoid air cells and middle ear cavities are clear. No significant scalp hematoma identified. IMPRESSION: No acute intracranial abnormality. 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 This may account for the reported clinical abnormality involving the right frontal region. Electronically Signed    By: Fidela Salisbury MD   On: 04/21/2020 04:35   CT Head Wo Contrast  Result Date: 04/17/2020 CLINICAL DATA:  Altered mental status.  Generalized weakness. EXAM: CT HEAD WITHOUT CONTRAST TECHNIQUE: Contiguous axial images were obtained from the base of the skull through the vertex without intravenous contrast. COMPARISON:  Head CT August 25, 2019 FINDINGS: Brain: No evidence of acute large vascular territory infarction, hemorrhage, hydrocephalus, extra-axial collection or mass lesion/mass effect. Similar mild age related parenchymal atrophy and ex vacuo dilatation of ventricular system. Mild burden of chronic microvascular ischemic white matter change, unchanged from prior. Vascular: No hyperdense vessel. Atherosclerotic calcifications of the internal carotid and vertebral arteries. 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 Sinuses/Orbits: No acute finding. Other: None. IMPRESSION: 1. No acute intracranial findings. 2. Similar mild parenchymal volume loss and sequela of chronic microvascular ischemic white matter disease. Electronically Signed   By: Dahlia Bailiff MD   On: 04/17/2020 15:48   DG Chest Port 1 View  Result Date: 04/20/2020 CLINICAL  DATA:  Hypercalcemia EXAM: PORTABLE CHEST 1 VIEW COMPARISON:  None. FINDINGS: Eventration of the right hemidiaphragm. No confluent airspace opacities or effusions. Heart is normal size. Aortic atherosclerosis. IMPRESSION: No active disease. Electronically Signed   By: Rolm Baptise M.D.   On: 04/20/2020 08:49     Subjective: Patient was resting comfortably when seen today.  I was able to wake her up easily, denies any complaint and stating that I am okay.  Appears very fragile.  Discharge Exam: Vitals:   04/28/20 0417 04/28/20 1221  BP: (!) 164/90 (!) 152/87  Pulse: 84 62  Resp: 16 16  Temp: (!) 97.5 F (36.4 C) 98.2 F (36.8 C)  SpO2: 99% 96%   Vitals:   04/27/20 1523  04/27/20 2325 04/28/20 0417 04/28/20 1221  BP: (!) 173/79 (!) 189/76 (!) 164/90 (!) 152/87  Pulse: 74 79 84 62  Resp: '17 16 16 16  '$ Temp: 98.2 F (36.8 C) 98 F (36.7 C) (!) 97.5 F (36.4 C) 98.2 F (36.8 C)  TempSrc:    Oral  SpO2: 98% 100% 99% 96%  Weight:      Height:        General: Pt is alert, awake, not in acute distress Cardiovascular: RRR, S1/S2 +, no rubs, no gallops Respiratory: CTA bilaterally, no wheezing, no rhonchi Abdominal: Soft, NT, ND, bowel sounds + Extremities: no edema, no cyanosis   The results of significant diagnostics from this hospitalization (including imaging, microbiology, ancillary and laboratory) are listed below for reference.    Microbiology: Recent Results (from the past 240 hour(s))  Resp Panel by RT-PCR (Flu A&B, Covid) Nasopharyngeal Swab     Status: None   Collection Time: 04/28/20 11:57 AM   Specimen: Nasopharyngeal Swab; Nasopharyngeal(NP) swabs in vial transport medium  Result Value Ref Range Status   SARS Coronavirus 2 by RT PCR NEGATIVE NEGATIVE Final    Comment: (NOTE) SARS-CoV-2 target nucleic acids are NOT DETECTED.  The SARS-CoV-2 RNA is generally detectable in upper respiratory specimens during the acute phase of infection. The lowest concentration of SARS-CoV-2 viral copies this assay can detect is 138 copies/mL. A negative result does not preclude SARS-Cov-2 infection and should not be used as the sole basis for treatment or other patient management decisions. A negative result may occur with  improper specimen collection/handling, submission of specimen other than nasopharyngeal swab, presence of viral mutation(s) within the areas targeted by this assay, and inadequate number of viral copies(<138 copies/mL). A negative result must be combined with clinical observations, patient history, and epidemiological information. The expected result is Negative.  Fact Sheet for Patients:   EntrepreneurPulse.com.au  Fact Sheet for Healthcare Providers:  IncredibleEmployment.be  This test is no t yet approved or cleared by the [place] FDA and  has been authorized for detection and/or diagnosis of SARS-CoV-2 by FDA under an Emergency Use Authorization (EUA). This EUA will remain  in effect (meaning this test can be used) for the duration of the COVID-19 declaration under Section 564(b)(1) of the Act, 21 U.S.C.section 360bbb-3(b)(1), unless the authorization is terminated  or revoked sooner.       Influenza A by PCR NEGATIVE NEGATIVE Final   Influenza B by PCR NEGATIVE NEGATIVE Final    Comment: (NOTE) The Xpert Xpress SARS-CoV-2/FLU/RSV plus assay is intended as an aid in the diagnosis of influenza from Nasopharyngeal swab specimens and should not be used as a sole basis for treatment. Nasal washings and aspirates are unacceptable for Xpert Xpress SARS-CoV-2/FLU/RSV testing.  Fact Sheet for Patients: EntrepreneurPulse.com.au  Fact Sheet for Healthcare Providers: IncredibleEmployment.be  This test is not yet approved or cleared by the [place] FDA and has been authorized for detection and/or diagnosis of SARS-CoV-2 by FDA under an Emergency Use Authorization (EUA). This EUA will remain in effect (meaning this test can be used) for the duration of the COVID-19 declaration under Section 564(b)(1) of the Act, 21 U.S.C. section 360bbb-3(b)(1), unless the authorization is terminated or revoked.  Performed at [facility], [address] [place], [place] [address]      Labs: BNP (last 3 results) No results for input(s): BNP in the last 8760 hours. Basic Metabolic Panel: Recent Labs  Lab 04/22/20 0727  NA 141  K 3.1*  CL 108  CO2 23  GLUCOSE 67*  BUN 19  CREATININE 1.22*  CALCIUM 9.6  MG 1.6*  PHOS 2.6   Liver Function Tests: No results for input(s): AST,  ALT, ALKPHOS, BILITOT, PROT, ALBUMIN in the last 168 hours. No results for input(s): LIPASE, AMYLASE in the last 168 hours. No results for input(s): AMMONIA in the last 168 hours. CBC: No results for input(s): WBC, NEUTROABS, HGB, HCT, MCV, PLT in the last 168 hours. Cardiac Enzymes: No results for input(s): CKTOTAL, CKMB, CKMBINDEX, TROPONINI in the last 168 hours. BNP: Invalid input(s): POCBNP CBG: No results for input(s): GLUCAP in the last 168 hours. D-Dimer No results for input(s): DDIMER in the last 72 hours. Hgb A1c No results for input(s): HGBA1C in the last 72 hours. Lipid Profile No results for input(s): CHOL, HDL, LDLCALC, TRIG, CHOLHDL, LDLDIRECT in the last 72 hours. Thyroid function studies No results for input(s): TSH, T4TOTAL, T3FREE, THYROIDAB in the last 72 hours.  Invalid input(s): FREET3 Anemia work up No results for input(s): VITAMINB12, FOLATE, FERRITIN, TIBC, IRON, RETICCTPCT in the last 72 hours. Urinalysis    Component Value Date/Time   COLORURINE YELLOW (A) 04/17/2020 1437   APPEARANCEUR HAZY (A) 04/17/2020 1437   LABSPEC 1.014 04/17/2020 1437   PHURINE 5.0 04/17/2020 1437   GLUCOSEU NEGATIVE 04/17/2020 1437   HGBUR NEGATIVE 04/17/2020 1437   BILIRUBINUR NEGATIVE 04/17/2020 1437   KETONESUR NEGATIVE 04/17/2020 1437   PROTEINUR NEGATIVE 04/17/2020 1437   NITRITE NEGATIVE 04/17/2020 1437   LEUKOCYTESUR NEGATIVE 04/17/2020 1437   Sepsis Labs Invalid input(s): PROCALCITONIN,  WBC,  LACTICIDVEN Microbiology Recent Results (from the past 240 hour(s))  Resp Panel by RT-PCR (Flu A&B, Covid) Nasopharyngeal Swab     Status: None   Collection Time: 04/28/20 11:57 AM   Specimen: Nasopharyngeal Swab; Nasopharyngeal(NP) swabs in vial transport medium  Result Value Ref Range Status   SARS Coronavirus 2 by RT PCR NEGATIVE NEGATIVE Final    Comment: (NOTE) SARS-CoV-2 target nucleic acids are NOT DETECTED.  The SARS-CoV-2 RNA is generally detectable in upper  respiratory specimens during the acute phase of infection. The lowest concentration of SARS-CoV-2 viral copies this assay can detect is 138 copies/mL. A negative result does not preclude SARS-Cov-2 infection and should not be used as the sole basis for treatment or other patient management decisions. A negative result may occur with  improper specimen collection/handling, submission of specimen other than nasopharyngeal swab, presence of viral mutation(s) within the areas targeted by this assay, and inadequate number of viral copies(<138 copies/mL). A negative result must be combined with clinical observations, patient history, and epidemiological information. The expected result is Negative.  Fact Sheet for Patients:  EntrepreneurPulse.com.au  Fact Sheet for Healthcare Providers:  IncredibleEmployment.be  This test is no t yet approved or cleared by the [place] and  has been authorized for detection and/or diagnosis of SARS-CoV-2 by FDA under an Emergency Use Authorization (EUA). This EUA will remain  in effect (meaning this test can be used) for the duration of the COVID-19 declaration under Section 564(b)(1) of the Act, 21 U.S.C.section 360bbb-3(b)(1), unless the authorization is terminated  or revoked sooner.       Influenza A by PCR NEGATIVE NEGATIVE Final   Influenza B by PCR NEGATIVE NEGATIVE Final    Comment: (NOTE) The Xpert Xpress SARS-CoV-2/FLU/RSV plus assay is intended as an aid in the diagnosis of influenza from Nasopharyngeal swab specimens and should not be used as a sole basis for treatment. Nasal washings and aspirates are unacceptable for Xpert Xpress SARS-CoV-2/FLU/RSV testing.  Fact Sheet for Patients: EntrepreneurPulse.com.au  Fact Sheet for Healthcare Providers: IncredibleEmployment.be  This test is not yet approved or cleared by the [place] FDA and has been  authorized for detection and/or diagnosis of SARS-CoV-2 by FDA under an Emergency Use Authorization (EUA). This EUA will remain in effect (meaning this test can be used) for the duration of the COVID-19 declaration under Section 564(b)(1) of the Act, 21 U.S.C. section 360bbb-3(b)(1), unless the authorization is terminated or revoked.  Performed at [facility], [address] [place], [place] [address]     Time coordinating discharge: Over 30 minutes  SIGNED:  Lorella Nimrod, MD  [facility] 04/28/2020, 1:46 PM  If 7PM-7AM, please contact night-coverage [email or web address]  This record has been created using Systems analyst. Errors have been sought and corrected,but may not always be located. Such creation errors do not reflect on the standard of care.

## 2020-04-28 NOTE — TOC Progression Note (Signed)
Transition of Care [facility] - Progression Note    Patient Details  Name: Sonya Craig MRN: [id number] Date of Birth: 09/10/1927  Transition of Care [facility]) CM/SW [place], RN Phone Number: 04/28/2020, 9:21 AM  Clinical Narrative:  RNCM met with patient's daughter [name] to present bed offers for [facility], [facility] and [facility]. Daughter chooses bed at [facility]. RNCM accepted bed in the hub and notified Seth Bake. RNCM started insurance authorization through Nav[employer] portal with a reference number of [id number].         Expected Discharge Plan and Services                                                 Social Determinants of Health (SDOH) Interventions    Readmission Risk Interventions No flowsheet data found.

## 2020-04-28 NOTE — Plan of Care (Signed)

## 2020-04-30 DIAGNOSIS — M6281 Muscle weakness (generalized): Secondary | ICD-10-CM | POA: Diagnosis not present

## 2020-05-04 DIAGNOSIS — M159 Polyosteoarthritis, unspecified: Secondary | ICD-10-CM

## 2020-05-04 DIAGNOSIS — N184 Chronic kidney disease, stage 4 (severe): Secondary | ICD-10-CM | POA: Diagnosis not present

## 2020-05-04 DIAGNOSIS — E1159 Type 2 diabetes mellitus with other circulatory complications: Secondary | ICD-10-CM

## 2020-05-04 DIAGNOSIS — E440 Moderate protein-calorie malnutrition: Secondary | ICD-10-CM | POA: Diagnosis not present

## 2020-05-04 DIAGNOSIS — R531 Weakness: Secondary | ICD-10-CM | POA: Diagnosis not present

## 2020-05-31 DIAGNOSIS — E1159 Type 2 diabetes mellitus with other circulatory complications: Secondary | ICD-10-CM

## 2020-05-31 DIAGNOSIS — M159 Polyosteoarthritis, unspecified: Secondary | ICD-10-CM | POA: Diagnosis not present

## 2020-05-31 DIAGNOSIS — E440 Moderate protein-calorie malnutrition: Secondary | ICD-10-CM | POA: Diagnosis not present

## 2020-05-31 DIAGNOSIS — N1831 Chronic kidney disease, stage 3a: Secondary | ICD-10-CM | POA: Diagnosis not present

## 2020-05-31 DIAGNOSIS — R531 Weakness: Secondary | ICD-10-CM

## 2020-06-02 ENCOUNTER — Non-Acute Institutional Stay: Payer: Medicare Other | Admitting: Adult Health Nurse Practitioner

## 2020-06-02 ENCOUNTER — Other Ambulatory Visit: Payer: Self-pay

## 2020-06-02 ENCOUNTER — Encounter: Payer: Self-pay | Admitting: Adult Health Nurse Practitioner

## 2020-06-02 VITALS — HR 86 | Wt 101.2 lb

## 2020-06-02 DIAGNOSIS — R5381 Other malaise: Secondary | ICD-10-CM

## 2020-06-02 DIAGNOSIS — Z515 Encounter for palliative care: Secondary | ICD-10-CM

## 2020-06-02 DIAGNOSIS — N1832 Chronic kidney disease, stage 3b: Secondary | ICD-10-CM

## 2020-06-02 DIAGNOSIS — E43 Unspecified severe protein-calorie malnutrition: Secondary | ICD-10-CM

## 2020-06-02 NOTE — Progress Notes (Signed)
[facility] Consult Note Telephone: [phone]  Fax: [phone]    Date of encounter: 06/02/20 PATIENT NAME: Sonya Craig [id number]   [phone] (home)  DOB: Dec 10, 1927 MRN: [id number] PRIMARY CARE PROVIDER:    Dr. Silvio Pate  REFERRING PROVIDER:   Dr. Silvio Pate  RESPONSIBLE PARTY:    Contact Information    Name Relation Home Work Mobile   [name] Daughter  [phone]   [name] Daughter [phone]     Tapp,Cookie Daughter [phone]         I met face to face with patient and family in facility. Palliative Care was asked to follow this patient by consultation request of  Kirk Ruths, MD to address advance care planning and complex medical decision making. This is the initial visit.                                     ASSESSMENT AND PLAN / RECOMMENDATIONS:   Advance Care Planning/Goals of Care: Goals include to maximize quality of life and symptom management.   CODE STATUS:  DNR  Symptom Management/Plan:  PCM: Patient seems to be doing well at the facility and appetite is improving and her weight is increasing.  Continue supportive care at the facility  Pain: Patient's pain is related to osteoarthritis.  She does get pain relief with diclofenac and as needed Tylenol.  Continue diclofenac and Tylenol as ordered  Debility: Patient is already gone through short-term rehab and is now a long-term care resident at the facility.  Daughter would like to try to keep her mother as active as possible.  We will reach out to PCP to see if there are restorative services that she can be involved in   Follow up Palliative Care Visit: Palliative care will continue to follow for complex medical decision making, advance care planning, and clarification of goals. Return 8-10 weeks or prn.  I spent 45 minutes providing this consultation. More than 50% of the time in this  consultation was spent in counseling and care coordination.   PPS: 30%  HOSPICE ELIGIBILITY/DIAGNOSIS: TBD  Chief Complaint: initial palliative visit for complex medical decision making  HISTORY OF PRESENT ILLNESS:  Sonya Craig is a 87 y.o. year old female  with HTN, CKD stage IV, DMT2, chronic debility and weakness.  Patient had hospitalization 2/26 through 04/28/2020 for confusion and weakness.  Was found to have hypercalcemia.  Patient was getting short-term rehab and no longer is under physical therapy or occupational therapy.  Patient is bed and wheelchair bound and can offer minimal assistance with transfers.  Requires assistance with ADLs except feeding.  Has occasional nighttime incontinence.  Patient's appetite has improved.  Upon admission at the beginning of March she was 91.6 pounds and on 05/21/2020 is 101.2 pounds.  Patient does complain of pain in bilateral knees with more in the right than the left.  Does have diclofenac drops that gives her relief along with as needed Tylenol.  Daughter states that her main concern is trying to keep her mother active.  States that her mother was able to walk with a walker prior to this episode.  Staff does not report any falls or recent infections.  Rest of 10 point ROS asked and negative except what is stated in HPI.  History obtained from review of EMR and interview with family,  facility staff and Ms. Culliver.  I reviewed available labs, medications, imaging, studies and related documents from the EMR.  Records reviewed and summarized above.    Physical Exam:  Constitutional: NAD General: frail appearing, thin EYES: anicteric sclera, lids intact, no discharge  ENMT: intact hearing, oral mucous membranes moist CV: S1S2, RRR, no LE edema; AV fistula right lower arm with bruit heard and thrill felt Pulmonary: LCTA, no increased work of breathing, no cough Abdomen:  normo-active BS + 4 quadrants, soft and non tender, no ascites MSK:  moves all  extremities, ambulatory Skin: warm and dry, no rashes or wounds on visible skin Neuro:  no generalized weakness,  A&O x3 Hem/lymph/immuno: no widespread bruising   CURRENT PROBLEM LIST:  Patient Active Problem List   Diagnosis Date Noted  . Delirium 04/27/2020  . AKI (acute kidney injury) ([place]) 04/24/2020  . CKD (chronic kidney disease), stage III ([place]) 04/24/2020  . Protein-calorie malnutrition, severe 04/19/2020  . Hypercalcemia 04/17/2020  . Essential hypertension 04/17/2020  . CKD (chronic kidney disease), stage IV ([place]) 04/17/2020  . At risk for dehydration due to poor fluid intake 04/17/2020  . Protein malnutrition ([place]) 04/17/2020  . Debility 04/17/2020   PAST MEDICAL HISTORY:  Active Ambulatory Problems    Diagnosis Date Noted  . Hypercalcemia 04/17/2020  . Essential hypertension 04/17/2020  . CKD (chronic kidney disease), stage IV ([place]) 04/17/2020  . At risk for dehydration due to poor fluid intake 04/17/2020  . Protein malnutrition ([place]) 04/17/2020  . Debility 04/17/2020  . Protein-calorie malnutrition, severe 04/19/2020  . AKI (acute kidney injury) ([place]) 04/24/2020  . CKD (chronic kidney disease), stage III ([place]) 04/24/2020  . Delirium 04/27/2020   Resolved Ambulatory Problems    Diagnosis Date Noted  . No Resolved Ambulatory Problems   Past Medical History:  Diagnosis Date  . Diabetes mellitus without complication ([place])   . Hypercholesterolemia   . Hypertension   . Stage 3 chronic kidney disease due to type 2 diabetes mellitus ([place])    SOCIAL HX:  Social History   Tobacco Use  . Smoking status: Never Smoker  . Smokeless tobacco: Never Used  Substance Use Topics  . Alcohol use: Yes    Comment: occasionally   FAMILY HX: No family history on file.    ALLERGIES: No Known Allergies   PERTINENT MEDICATIONS:  Outpatient Encounter Medications as of 06/02/2020  Medication Sig  . acetaminophen (TYLENOL) 325 MG tablet Take 2 tablets (650 mg total) by mouth  every 6 (six) hours as needed for mild pain (or Fever >/= 101).  Marland Kitchen aspirin 81 MG EC tablet Take 81 mg by mouth daily at 6 (six) AM.  . Diclofenac Sodium 1.5 % SOLN APPLY 40 DROPS TOPICALLY 4 TIMES A DAY TO AFFECTED AREA  . feeding supplement (ENSURE ENLIVE / ENSURE PLUS) LIQD Take 237 mLs by mouth 2 (two) times daily between meals.  Marland Kitchen lisinopril (ZESTRIL) 20 MG tablet Take 1 tablet (20 mg total) by mouth daily.  Marland Kitchen lovastatin (MEVACOR) 40 MG tablet Take 40 mg by mouth daily.  . mirtazapine (REMERON) 7.5 MG tablet Take 7.5 mg by mouth at bedtime.  . ondansetron (ZOFRAN-ODT) 4 MG disintegrating tablet Take 1 tablet (4 mg total) by mouth every 6 (six) hours as needed for nausea.   No facility-administered encounter medications on file as of 06/02/2020.    Thank you for the opportunity to participate in the care of Ms. Shannahan.  The palliative care team will continue to follow.  Please call our office at [phone] if we can be of additional assistance.   Quy Lotts Jenetta Downer, NP , DNP  This chart was dictated using voice recognition software. Despite best efforts to proofread, errors can occur which can change the documentation meaning.   COVID-19 PATIENT SCREENING TOOL Asked and negative response unless otherwise noted:   Have you had symptoms of covid, tested positive or been in contact with someone with symptoms/positive test in the past 5-10 days? negative

## 2020-07-02 DIAGNOSIS — G451 Carotid artery syndrome (hemispheric): Secondary | ICD-10-CM

## 2020-07-02 DIAGNOSIS — M6281 Muscle weakness (generalized): Secondary | ICD-10-CM

## 2020-07-02 DIAGNOSIS — M1990 Unspecified osteoarthritis, unspecified site: Secondary | ICD-10-CM | POA: Diagnosis not present

## 2020-07-02 DIAGNOSIS — E1159 Type 2 diabetes mellitus with other circulatory complications: Secondary | ICD-10-CM | POA: Diagnosis not present

## 2020-07-02 DIAGNOSIS — I10 Essential (primary) hypertension: Secondary | ICD-10-CM

## 2020-07-02 DIAGNOSIS — N183 Chronic kidney disease, stage 3 unspecified: Secondary | ICD-10-CM | POA: Diagnosis not present

## 2020-07-02 DIAGNOSIS — E876 Hypokalemia: Secondary | ICD-10-CM

## 2020-07-02 DIAGNOSIS — E43 Unspecified severe protein-calorie malnutrition: Secondary | ICD-10-CM | POA: Diagnosis not present

## 2020-07-28 DIAGNOSIS — S90424A Blister (nonthermal), right lesser toe(s), initial encounter: Secondary | ICD-10-CM

## 2020-07-28 DIAGNOSIS — E11628 Type 2 diabetes mellitus with other skin complications: Secondary | ICD-10-CM

## 2020-08-10 DIAGNOSIS — M159 Polyosteoarthritis, unspecified: Secondary | ICD-10-CM

## 2020-08-10 DIAGNOSIS — E441 Mild protein-calorie malnutrition: Secondary | ICD-10-CM | POA: Diagnosis not present

## 2020-08-10 DIAGNOSIS — N1832 Chronic kidney disease, stage 3b: Secondary | ICD-10-CM

## 2020-08-10 DIAGNOSIS — R54 Age-related physical debility: Secondary | ICD-10-CM

## 2020-08-10 DIAGNOSIS — E1159 Type 2 diabetes mellitus with other circulatory complications: Secondary | ICD-10-CM

## 2020-08-10 DIAGNOSIS — I10 Essential (primary) hypertension: Secondary | ICD-10-CM

## 2020-10-01 ENCOUNTER — Non-Acute Institutional Stay: Payer: Medicare Other | Admitting: Student

## 2020-10-01 ENCOUNTER — Other Ambulatory Visit: Payer: Self-pay

## 2020-10-01 DIAGNOSIS — R5381 Other malaise: Secondary | ICD-10-CM

## 2020-10-01 DIAGNOSIS — Z515 Encounter for palliative care: Secondary | ICD-10-CM

## 2020-10-01 DIAGNOSIS — E43 Unspecified severe protein-calorie malnutrition: Secondary | ICD-10-CM

## 2020-10-01 NOTE — Progress Notes (Signed)
[place] Consult Note Telephone: [phone]  Fax: [phone]    Date of encounter: 10/01/20 PATIENT NAME: [place] [id number]   [phone] (home)  DOB: 1927/06/02 MRN: [id number] PRIMARY CARE PROVIDER:    Dr. Silvio Pate  REFERRING PROVIDER:   Dr. Silvio Pate  RESPONSIBLE PARTY:    Contact Information     Name Relation Home Work Mobile   Sonya Craig Daughter  [phone]   Sonya Craig Daughter [phone]     Craig,Sonya Daughter [phone]          I met face to face with patient in the facility. Palliative Care was asked to follow this patient by consultation request of Dr. Silvio Pate to address advance care planning and complex medical decision making. This is a follow up visit.                                   ASSESSMENT AND PLAN / RECOMMENDATIONS:   Advance Care Planning/Goals of Care: Goals include to maximize quality of life and symptom management.  CODE STATUS: DNR  Symptom Management/Plan:  Protein calorie malnutrition-patient's weight has been stable the past 3 months. Current weight 101 pounds. She continues to receive medpass BID and Enlive nutritional supplements. Family also brings in outside food. Staff will continue to weigh routinely. Monitor for weight loss.   Debility-patient resides on long term care unit. She is out of bed as tolerated, per her preference. She ambulates short distances with walker; w/c also used for locomotion. Will monitor for functional declines.   Follow up Palliative Care Visit: Palliative care will continue to follow for complex medical decision making, advance care planning, and clarification of goals. Return in 8 weeks or prn.  I spent 25 minutes providing this consultation. More than 50% of the time in this consultation was spent in counseling and care coordination.    PPS: 40%  HOSPICE ELIGIBILITY/DIAGNOSIS:  TBD  Chief Complaint: Palliative medicine follow up visit.   HISTORY OF PRESENT ILLNESS:  Sonya Craig is a 87 y.o. year old female  with Protein calorie malnutrition, hypertension, CKD stage 4, T2DM, debility, generalized weakness.  Patient resides at [facility]. She denies pain, shortness of breath, constipation or nausea. She states she is eating good, while staff report a poor appetite per her baseline. She is receiving nutritional supplements and family also brings in foods that she likes. She is out of bed every other day, per her preference. She is able to ambulate short distances; w/c also used for locomotion. She will sometimes decline to get out of bed. No recent falls or injury. She reports occasional foot pain, knee pain; receives acetaminophen prn and diclofenac with relief.  A 10 point review of systems is negative, except for the pertinent positives and negatives detailed in the HPI.    History obtained from review of EMR, discussion with primary team, and interview with family, facility staff/caregiver and/or Sonya Craig.  I reviewed available labs, medications, imaging, studies and related documents from the EMR.  Records reviewed and summarized above.    Physical Exam: Weight: 101 pounds August ,2022 Constitutional: NAD General: frail appearing, thin EYES: anicteric sclera, lids intact, no discharge  ENMT: intact hearing, oral mucous membranes moist CV: S1S2, RRR, no LE edema Pulmonary: LCTA, no increased work of breathing, no cough, room air Abdomen: normo-active BS + 4 quadrants, soft  and non tender GU: deferred MSK: sarcopenia, moves all extremities Skin: warm and dry, no rashes or wounds on visible skin Neuro: generalized weakness, A & O x 3 Psych: non-anxious affect Hem/lymph/immuno: no widespread bruising   Thank you for the opportunity to participate in the care of Sonya Craig.  The palliative care team will continue to follow. Please call our office at  [phone] if we can be of additional assistance.   Ezekiel Slocumb, NP   COVID-19 PATIENT SCREENING TOOL Asked and negative response unless otherwise noted:   Have you had symptoms of covid, tested positive or been in contact with someone with symptoms/positive test in the past 5-10 days? No

## 2020-10-08 DIAGNOSIS — E876 Hypokalemia: Secondary | ICD-10-CM

## 2020-10-08 DIAGNOSIS — M1990 Unspecified osteoarthritis, unspecified site: Secondary | ICD-10-CM

## 2020-10-08 DIAGNOSIS — I779 Disorder of arteries and arterioles, unspecified: Secondary | ICD-10-CM

## 2020-10-08 DIAGNOSIS — I10 Essential (primary) hypertension: Secondary | ICD-10-CM | POA: Diagnosis not present

## 2020-10-08 DIAGNOSIS — E1159 Type 2 diabetes mellitus with other circulatory complications: Secondary | ICD-10-CM

## 2020-10-08 DIAGNOSIS — N183 Chronic kidney disease, stage 3 unspecified: Secondary | ICD-10-CM | POA: Diagnosis not present

## 2020-10-08 DIAGNOSIS — M6281 Muscle weakness (generalized): Secondary | ICD-10-CM | POA: Diagnosis not present

## 2020-10-08 DIAGNOSIS — E43 Unspecified severe protein-calorie malnutrition: Secondary | ICD-10-CM | POA: Diagnosis not present

## 2020-12-09 DIAGNOSIS — N1832 Chronic kidney disease, stage 3b: Secondary | ICD-10-CM | POA: Diagnosis not present

## 2020-12-09 DIAGNOSIS — E441 Mild protein-calorie malnutrition: Secondary | ICD-10-CM | POA: Diagnosis not present

## 2020-12-09 DIAGNOSIS — I10 Essential (primary) hypertension: Secondary | ICD-10-CM | POA: Diagnosis not present

## 2020-12-09 DIAGNOSIS — E1159 Type 2 diabetes mellitus with other circulatory complications: Secondary | ICD-10-CM | POA: Diagnosis not present

## 2020-12-10 LAB — HEMOGLOBIN A1C: Hemoglobin A1C: 5

## 2021-02-02 DIAGNOSIS — R532 Functional quadriplegia: Secondary | ICD-10-CM

## 2021-02-02 DIAGNOSIS — M1990 Unspecified osteoarthritis, unspecified site: Secondary | ICD-10-CM

## 2021-02-02 DIAGNOSIS — E1159 Type 2 diabetes mellitus with other circulatory complications: Secondary | ICD-10-CM | POA: Diagnosis not present

## 2021-02-02 DIAGNOSIS — E43 Unspecified severe protein-calorie malnutrition: Secondary | ICD-10-CM

## 2021-02-02 DIAGNOSIS — N183 Chronic kidney disease, stage 3 unspecified: Secondary | ICD-10-CM | POA: Diagnosis not present

## 2021-02-02 DIAGNOSIS — I10 Essential (primary) hypertension: Secondary | ICD-10-CM | POA: Diagnosis not present

## 2021-02-02 DIAGNOSIS — I25119 Atherosclerotic heart disease of native coronary artery with unspecified angina pectoris: Secondary | ICD-10-CM

## 2021-02-02 DIAGNOSIS — E876 Hypokalemia: Secondary | ICD-10-CM

## 2021-03-02 ENCOUNTER — Non-Acute Institutional Stay: Payer: Medicare Other | Admitting: Student

## 2021-03-02 ENCOUNTER — Other Ambulatory Visit: Payer: Self-pay

## 2021-03-02 DIAGNOSIS — E43 Unspecified severe protein-calorie malnutrition: Secondary | ICD-10-CM

## 2021-03-02 DIAGNOSIS — R52 Pain, unspecified: Secondary | ICD-10-CM

## 2021-03-02 DIAGNOSIS — Z515 Encounter for palliative care: Secondary | ICD-10-CM

## 2021-03-02 DIAGNOSIS — R531 Weakness: Secondary | ICD-10-CM

## 2021-03-02 NOTE — Progress Notes (Signed)
[place] Consult Note Telephone: [phone]  Fax: [phone]    Date of encounter: 03/02/21  PATIENT NAME: [place] [id number]   [phone] (home)  DOB: 03-Dec-1927 MRN: [id number] PRIMARY CARE PROVIDER:    Dr. Silvio Pate  REFERRING PROVIDER:   Dr. Silvio Pate  RESPONSIBLE PARTY:    Contact Information     Name Relation Home Work Mobile   [name] Daughter  [phone]   [name] Daughter [phone]     Tapp,Cookie Daughter [phone]          I met face to face with patient in the facility. Palliative Care was asked to follow this patient by consultation request of  Dr. Silvio Pate to address advance care planning and complex medical decision making. This is a follow up visit.                                   ASSESSMENT AND PLAN / RECOMMENDATIONS:   Advance Care Planning/Goals of Care: Goals include to maximize quality of life and symptom management.   CODE STATUS: DNR  Symptom Management/Plan:  Protein calorie malnutrition-weight is stable; 102.4 pounds.  Continue medpass and Enlive nutritional supplements. Family also brings in outside food. Staff will continue to weigh routinely. Monitor for further weight loss.  Generalized weakness, debility-patient rarely getting out of bed, per her preference. Assist out of bed as tolerated. Staff will continue to assist with adl's.   Pain-due to her OA of knees. Continue acetaminophen, diclofenac as directed.   Follow up Palliative Care Visit: Palliative care will continue to follow for complex medical decision making, advance care planning, and clarification of goals. Return in 8-12 weeks or prn.  This visit was coded based on medical decision making (MDM).  PPS: 40%, weak  HOSPICE ELIGIBILITY/DIAGNOSIS: TBD  Chief Complaint: Palliative Medicine follow up visit.   HISTORY OF PRESENT ILLNESS:  DOLCE SYLVIA  is a 88 y.o. year old female  with Protein calorie malnutrition, hypertension, CKD stage 4, T2DM, debility, generalized weakness, OA.    Patient resides at [facility]. She denies shortness of breath, constipation or nausea. She denies pain at present; does endorse occasional knee pain. She endorses a good appetite, although staff endorse appetite being poor per her baseline. She is receiving nutritional supplements and family also brings in foods that she likes. Staff report patient is not getting out of bed as much. A 10 point review of systems is negative, except for the pertinent positives and negatives detailed in the HPI.   History obtained from review of EMR, discussion with primary team, and interview with family, facility staff/caregiver and/or Ms. Lopezmartinez.  I reviewed available labs, medications, imaging, studies and related documents from the EMR.  Records reviewed and summarized above.   Physical Exam: Weight: 102.4 pounds  Pulse 72, resp 16, sats 96% on room air Constitutional: NAD General: frail appearing, thin EYES: anicteric sclera, lids intact, no discharge  ENMT: intact hearing, oral mucous membranes moist, dentition intact CV: S1S2, RRR, no LE edema Pulmonary: LCTA, no increased work of breathing, no cough, room air Abdomen:  normo-active BS + 4 quadrants, soft and non tender, no ascites GU: deferred MSK: sarcopenia, moves all extremities Skin: warm and dry, no rashes or wounds on visible skin Neuro: generalized weakness, A & O x 3 Psych: non-anxious affect, pleasant Hem/lymph/immuno: no widespread bruising  Thank you for the opportunity to participate in the care of Ms. Okey.  The palliative care team will continue to follow. Please call our office at [phone] if we can be of additional assistance.   Ezekiel Slocumb, NP   COVID-19 PATIENT SCREENING TOOL Asked and negative response unless otherwise noted:   Have you had symptoms of covid, tested positive or  been in contact with someone with symptoms/positive test in the past 5-10 days? No

## 2021-04-05 DIAGNOSIS — F0150 Vascular dementia without behavioral disturbance: Secondary | ICD-10-CM

## 2021-04-05 DIAGNOSIS — I10 Essential (primary) hypertension: Secondary | ICD-10-CM | POA: Diagnosis not present

## 2021-04-05 DIAGNOSIS — E1159 Type 2 diabetes mellitus with other circulatory complications: Secondary | ICD-10-CM | POA: Diagnosis not present

## 2021-04-05 DIAGNOSIS — R5381 Other malaise: Secondary | ICD-10-CM | POA: Diagnosis not present

## 2021-04-05 DIAGNOSIS — E441 Mild protein-calorie malnutrition: Secondary | ICD-10-CM | POA: Diagnosis not present

## 2021-04-05 DIAGNOSIS — N1832 Chronic kidney disease, stage 3b: Secondary | ICD-10-CM

## 2021-05-23 LAB — BASIC METABOLIC PANEL
BUN: 31 — AB (ref 4–21)
CO2: 20 (ref 13–22)
Chloride: 116 — AB (ref 99–108)
Creatinine: 1.4 — AB (ref 0.5–1.1)
Glucose: 72
Potassium: 3.7 mEq/L (ref 3.5–5.1)
Sodium: 141 (ref 137–147)

## 2021-05-23 LAB — COMPREHENSIVE METABOLIC PANEL
Albumin: 3 — AB (ref 3.5–5.0)
Calcium: 8.4 — AB (ref 8.7–10.7)
eGFR: 34

## 2021-05-27 ENCOUNTER — Non-Acute Institutional Stay: Payer: Medicare Other | Admitting: Student

## 2021-05-27 DIAGNOSIS — Z515 Encounter for palliative care: Secondary | ICD-10-CM

## 2021-05-27 DIAGNOSIS — R531 Weakness: Secondary | ICD-10-CM

## 2021-05-27 DIAGNOSIS — E43 Unspecified severe protein-calorie malnutrition: Secondary | ICD-10-CM

## 2021-05-27 DIAGNOSIS — R52 Pain, unspecified: Secondary | ICD-10-CM

## 2021-05-27 NOTE — Progress Notes (Signed)
? ? ?[facility] ?[facility] Consult Note ?Telephone: [phone]  ?Fax: [phone]  ? ? ?Date of encounter: 05/27/21 ?1:53 PM ?PAT[place] NAME: [place] [id number]   ?[phone] (home)  ?DOB: Jun 26, 1927 ?MRN: [id number] ?PRIMARY CARE PROVIDER:    ?Dr. Silvio Pate ? ?REFERRING PROVIDER:   ?Dr. Silvio Pate ? ?RESPONSIBLE PARTY:    ?Contact Information   ? ? Name Relation Home Work Mobile  ? Derrek Monaco Daughter  [phone]  ? Parker,Penny Daughter [phone]    ? Tapp,Cookie Daughter [phone]    ? ?  ? ? ? ?I met face to face with patient in the facility. Palliative Care was asked to follow this patient by consultation request of  Dr. Silvio Pate to address advance care planning and complex medical decision making. This is a follow up visit. ? ?                                 ASSESSMENT AND PLAN / RECOMMENDATIONS:  ? ?Advance Care Planning/Goals of Care: Goals include to maximize quality of life and symptom management. Patient/health care surrogate gave his/her permission to discuss. ?Our advance care planning conversation included a discussion about:    ?The value and importance of advance care planning  ?Experiences with loved ones who have been seriously ill or have died  ?Exploration of personal, cultural or spiritual beliefs that might influence medical decisions  ?Exploration of goals of care in the event of a sudden injury or illness  ?CODE STATUS: DNR ? ?Palliative Medicine will continue to provide supportive care, symptom management.  ? ?Symptom Management/Plan: ? ?Generalized weakness, debility-patient rarely getting out of bed, per her preference. Assist out of bed as tolerated. Staff will continue to assist with adl's. Turn and reposition for comfort; encourage heels to be elevated.  ?  ?Pain-due to her OA of knees. Continue acetaminophen, diclofenac as directed. ?  ?Protein calorie malnutrition-weight continues to be  stable; 102 pounds.  Continue medpass and Enlive nutritional supplements. Encourage foods patient enjoys. Continue routine weights. Monitor for further weight loss. ? ?Follow up Palliative Care Visit: Palliative care will continue to follow for complex medical decision making, advance care planning, and clarification of goals. Return in 8 weeks or prn. ? ? ?This visit was coded based on medical decision making (MDM). ? ?PPS: 30% ? ?HOSPICE ELIGIBILITY/DIAGNOSIS: TBD ? ?Chief Complaint: Palliative Medicine initial consult. ? ?HISTORY OF PRESENT ILLNESS:  Sonya Craig is a 88 y.o. year old female  with Protein calorie malnutrition, hypertension, CKD stage 4, T2DM, debility, generalized weakness, OA.    ? ?Patient resides at [facility]. Patient rarely gets out of bed now per facility staff.  She denies pain at present; she does reduce report pain to the knees occasionally.  She denies shortness of breath, nausea, constipation.  She endorses a fair appetite; she states she eats foods that she enjoys.  Her weight has been stable. She reports sleeping well at night. A 10 point review of systems is negative, except for the pertinent positives and negatives detailed in the HPI.  ? ?History obtained from review of EMR, discussion with primary team, and interview with family, facility staff/caregiver and/or Ms. Antigua.  ?I reviewed available labs, medications, imaging, studies and related documents from the EMR.  Records reviewed and summarized above.  ? ? ?Physical Exam: ?Weight: 102 pounds ?Pulse 80, resp 20, sats  98% on room air ?Constitutional: NAD ?General: frail appearing ?EYES: anicteric sclera, lids intact, no discharge  ?ENMT: intact hearing, oral mucous membranes moist, dentition intact ?CV: S1S2, RRR, no LE edema ?Pulmonary: LCTA, no increased work of breathing, no cough, room air ?Abdomen: normo-active BS + 4 quadrants, soft and non tender ?GU: deferred ?MSK: + sarcopenia, moves all extremities,  nn-ambulatory ?Skin: warm and dry, no rashes or wounds on visible skin ?Neuro:  + generalized weakness ?Psych: non-anxious affect, A and O x 3 ?Hem/lymph/immuno: no widespread bruising ? ? ?Thank you for the opportunity to participate in the care of Ms. Dettore.  The palliative care team will continue to follow. Please call our office at [phone] if we can be of additional assistance.  ? ?Ezekiel Slocumb, NP  ? ?COVID-19 PATIENT SCREENING TOOL ?Asked and negative response unless otherwise noted:  ? ?Have you had symptoms of covid, tested positive or been in contact with someone with symptoms/positive test in the past 5-10 days? No ? ?

## 2021-06-08 DIAGNOSIS — R531 Weakness: Secondary | ICD-10-CM | POA: Diagnosis not present

## 2021-06-08 DIAGNOSIS — F0150 Vascular dementia without behavioral disturbance: Secondary | ICD-10-CM

## 2021-06-08 DIAGNOSIS — I350 Nonrheumatic aortic (valve) stenosis: Secondary | ICD-10-CM

## 2021-06-08 DIAGNOSIS — I10 Essential (primary) hypertension: Secondary | ICD-10-CM | POA: Diagnosis not present

## 2021-06-08 DIAGNOSIS — E43 Unspecified severe protein-calorie malnutrition: Secondary | ICD-10-CM | POA: Diagnosis not present

## 2021-06-08 DIAGNOSIS — E876 Hypokalemia: Secondary | ICD-10-CM

## 2021-06-08 DIAGNOSIS — M1990 Unspecified osteoarthritis, unspecified site: Secondary | ICD-10-CM

## 2021-06-08 DIAGNOSIS — N183 Chronic kidney disease, stage 3 unspecified: Secondary | ICD-10-CM

## 2021-06-08 DIAGNOSIS — E1159 Type 2 diabetes mellitus with other circulatory complications: Secondary | ICD-10-CM | POA: Diagnosis not present

## 2021-06-21 LAB — HEMOGLOBIN A1C: Hemoglobin A1C: 4.9

## 2021-07-20 ENCOUNTER — Non-Acute Institutional Stay: Payer: Medicare Other | Admitting: Student

## 2021-07-20 DIAGNOSIS — Z515 Encounter for palliative care: Secondary | ICD-10-CM

## 2021-07-20 DIAGNOSIS — E43 Unspecified severe protein-calorie malnutrition: Secondary | ICD-10-CM

## 2021-07-20 DIAGNOSIS — R531 Weakness: Secondary | ICD-10-CM

## 2021-07-20 DIAGNOSIS — R5381 Other malaise: Secondary | ICD-10-CM

## 2021-07-20 NOTE — Progress Notes (Signed)
[place] Consult Note Telephone: [phone]  Fax: [phone]    Date of encounter: 07/20/21 4:14 PM PATIENT NAME: Sonya [place] [id number]   [phone] (home)  DOB: 1927/10/20 MRN: [id number] PRIMARY CARE PROVIDER:    Dr. Silvio Pate  REFERRING PROVIDER:   Dr. Silvio Pate  RESPONSIBLE PARTY:    Contact Information     Name Relation Home Work Mobile   [name] Daughter  [phone]   [name] Daughter [phone]     Tapp,Cookie Daughter [phone]          I met face to face with patient in the facility. Palliative Care was asked to follow this patient by consultation request of  Dr. Silvio Pate to address advance care planning and complex medical decision making. This is a follow up visit.                                   ASSESSMENT AND PLAN / RECOMMENDATIONS:   Advance Care Planning/Goals of Care: Goals include to maximize quality of life and symptom management. Patient/health care surrogate gave his/her permission to discuss. CODE STATUS: DNR  Palliative Medicine will continue to provide supportive care, monitor for changes and declines, provide symptom management.   Symptom Management/Plan:  Protein calorie malnutrition- patient with poor appetite, last weight on file is 102 pounds. She is eating foods she enjoys, food brought in by family. She often declines nutritional supplements. Will continue to monitor for weight loss.   Generalized weakness, debility-patient stays in bed per her preference. Staff will continue to assist with adl's. Turn and reposition for comfort; encourage heels to be elevated.  Follow up Palliative Care Visit: Palliative care will continue to follow for complex medical decision making, advance care planning, and clarification of goals. Return in 8-12 weeks or prn.   This visit was coded based on medical decision making (MDM).  PPS:  30%  HOSPICE ELIGIBILITY/DIAGNOSIS: TBD  Chief Complaint: Palliative Medicine follow up visit.   HISTORY OF PRESENT ILLNESS:  GENESI Craig is a 88 y.o. year old female  with Protein calorie malnutrition, hypertension, CKD stage 4, T2DM, debility, generalized weakness, OA.      Patient resides at [facility]. Patient requires assistance with adl's; she is bed bound. She does report right arm pain; she received Covid-19 vaccine yesterday per staff. Staff to administer acetaminophen. She denies shortness of breath, nausea, constipation. Her appetite varies; although poor to fair overall. She is eating foods she enjoys and food family brings in. She will refuse nutritional supplements. She naps intermittently throughout the day; sleeping well at night. A 10 point review of systems is negative, except for the pertinent positives and negatives detailed in the HPI.     History obtained from review of EMR, discussion with primary team, and interview with family, facility staff/caregiver and/or Ms. Bentson.  I reviewed available labs, medications, imaging, studies and related documents from the EMR.  Records reviewed and summarized above.    Physical Exam: Constitutional: NAD General: frail appearing, thin EYES: anicteric sclera, lids intact, no discharge  ENMT: intact hearing, oral mucous membranes moist, dentition intact CV: S1S2, RRR, trace LE edema Pulmonary: LCTA, no increased work of breathing, no cough, room air Abdomen: normo-active BS + 4 quadrants, soft and non tender, no ascites GU: deferred MSK: sarcopenia, moves all extremities,non- ambulatory Skin: warm and dry,  no rashes or wounds on visible skin Neuro: + generalized weakness, A & O x 3 Psych: non-anxious affect Hem/lymph/immuno: no widespread bruising   Thank you for the opportunity to participate in the care of Ms. Laursen.  The palliative care team will continue to follow. Please call our office at [phone] if we can be  of additional assistance.   Ezekiel Slocumb, NP   COVID-19 PATIENT SCREENING TOOL Asked and negative response unless otherwise noted:   Have you had symptoms of covid, tested positive or been in contact with someone with symptoms/positive test in the past 5-10 days? No

## 2021-08-09 DIAGNOSIS — I872 Venous insufficiency (chronic) (peripheral): Secondary | ICD-10-CM

## 2021-08-09 DIAGNOSIS — E441 Mild protein-calorie malnutrition: Secondary | ICD-10-CM

## 2021-08-09 DIAGNOSIS — F0150 Vascular dementia without behavioral disturbance: Secondary | ICD-10-CM | POA: Diagnosis not present

## 2021-08-09 DIAGNOSIS — R5381 Other malaise: Secondary | ICD-10-CM | POA: Diagnosis not present

## 2021-08-09 DIAGNOSIS — E1159 Type 2 diabetes mellitus with other circulatory complications: Secondary | ICD-10-CM | POA: Diagnosis not present

## 2021-08-09 DIAGNOSIS — I10 Essential (primary) hypertension: Secondary | ICD-10-CM | POA: Diagnosis not present

## 2021-09-22 LAB — BASIC METABOLIC PANEL
BUN: 50 — AB (ref 4–21)
CO2: 12 — AB (ref 13–22)
Chloride: 123 — AB (ref 99–108)
Creatinine: 2 — AB (ref 0.5–1.1)
Glucose: 67
Potassium: 3.4 mEq/L — AB (ref 3.5–5.1)
Sodium: 145 (ref 137–147)

## 2021-09-22 LAB — CBC: RBC: 3.41 — AB (ref 3.87–5.11)

## 2021-09-22 LAB — HEPATIC FUNCTION PANEL
ALT: 7 U/L (ref 7–35)
AST: 17 (ref 13–35)
Alkaline Phosphatase: 47 (ref 25–125)
Bilirubin, Total: 0.5

## 2021-09-22 LAB — COMPREHENSIVE METABOLIC PANEL
Calcium: 8.7 (ref 8.7–10.7)
Globulin: 2.4
eGFR: 23

## 2021-09-22 LAB — CBC AND DIFFERENTIAL
HCT: 33 — AB (ref 36–46)
Hemoglobin: 10.8 — AB (ref 12.0–16.0)
Platelets: 182 10*3/uL (ref 150–400)
WBC: 3.8

## 2021-09-23 ENCOUNTER — Other Ambulatory Visit: Payer: Self-pay

## 2021-09-23 ENCOUNTER — Emergency Department: Payer: Medicare Other

## 2021-09-23 ENCOUNTER — Inpatient Hospital Stay
Admission: EM | Admit: 2021-09-23 | Discharge: 2021-09-27 | DRG: 391 | Disposition: A | Discharge status: Skilled Nursing Facility | Payer: Medicare Other | Source: Skilled Nursing Facility | Attending: Internal Medicine | Admitting: Internal Medicine

## 2021-09-23 DIAGNOSIS — I2510 Atherosclerotic heart disease of native coronary artery without angina pectoris: Secondary | ICD-10-CM

## 2021-09-23 DIAGNOSIS — Z681 Body mass index (BMI) 19 or less, adult: Secondary | ICD-10-CM | POA: Diagnosis not present

## 2021-09-23 DIAGNOSIS — K5939 Other megacolon: Secondary | ICD-10-CM | POA: Diagnosis present

## 2021-09-23 DIAGNOSIS — Z66 Do not resuscitate: Secondary | ICD-10-CM | POA: Diagnosis present

## 2021-09-23 DIAGNOSIS — I129 Hypertensive chronic kidney disease with stage 1 through stage 4 chronic kidney disease, or unspecified chronic kidney disease: Secondary | ICD-10-CM | POA: Diagnosis present

## 2021-09-23 DIAGNOSIS — K5981 Ogilvie syndrome: Principal | ICD-10-CM | POA: Diagnosis present

## 2021-09-23 DIAGNOSIS — R531 Weakness: Secondary | ICD-10-CM | POA: Diagnosis not present

## 2021-09-23 DIAGNOSIS — Z79899 Other long term (current) drug therapy: Secondary | ICD-10-CM

## 2021-09-23 DIAGNOSIS — N183 Chronic kidney disease, stage 3 unspecified: Secondary | ICD-10-CM

## 2021-09-23 DIAGNOSIS — E876 Hypokalemia: Secondary | ICD-10-CM | POA: Diagnosis not present

## 2021-09-23 DIAGNOSIS — R140 Abdominal distension (gaseous): Principal | ICD-10-CM

## 2021-09-23 DIAGNOSIS — E1122 Type 2 diabetes mellitus with diabetic chronic kidney disease: Secondary | ICD-10-CM | POA: Diagnosis present

## 2021-09-23 DIAGNOSIS — E43 Unspecified severe protein-calorie malnutrition: Secondary | ICD-10-CM | POA: Diagnosis present

## 2021-09-23 DIAGNOSIS — E46 Unspecified protein-calorie malnutrition: Secondary | ICD-10-CM | POA: Diagnosis present

## 2021-09-23 DIAGNOSIS — I872 Venous insufficiency (chronic) (peripheral): Secondary | ICD-10-CM

## 2021-09-23 DIAGNOSIS — E7800 Pure hypercholesterolemia, unspecified: Secondary | ICD-10-CM | POA: Diagnosis present

## 2021-09-23 DIAGNOSIS — F0150 Vascular dementia without behavioral disturbance: Secondary | ICD-10-CM

## 2021-09-23 DIAGNOSIS — I10 Essential (primary) hypertension: Secondary | ICD-10-CM | POA: Diagnosis present

## 2021-09-23 DIAGNOSIS — N184 Chronic kidney disease, stage 4 (severe): Secondary | ICD-10-CM | POA: Diagnosis present

## 2021-09-23 DIAGNOSIS — K567 Ileus, unspecified: Secondary | ICD-10-CM

## 2021-09-23 DIAGNOSIS — M1990 Unspecified osteoarthritis, unspecified site: Secondary | ICD-10-CM

## 2021-09-23 DIAGNOSIS — E1159 Type 2 diabetes mellitus with other circulatory complications: Secondary | ICD-10-CM | POA: Diagnosis not present

## 2021-09-23 LAB — CBC
HCT: 36.4 % (ref 36.0–46.0)
Hemoglobin: 11.5 g/dL — ABNORMAL LOW (ref 12.0–15.0)
MCH: 31.2 pg (ref 26.0–34.0)
MCHC: 31.6 g/dL (ref 30.0–36.0)
MCV: 98.6 fL (ref 80.0–100.0)
Platelets: 185 10*3/uL (ref 150–400)
RBC: 3.69 MIL/uL — ABNORMAL LOW (ref 3.87–5.11)
RDW: 15.1 % (ref 11.5–15.5)
WBC: 4.2 10*3/uL (ref 4.0–10.5)
nRBC: 0 % (ref 0.0–0.2)

## 2021-09-23 LAB — COMPREHENSIVE METABOLIC PANEL
ALT: 11 U/L (ref 0–44)
AST: 24 U/L (ref 15–41)
Albumin: 3.7 g/dL (ref 3.5–5.0)
Alkaline Phosphatase: 52 U/L (ref 38–126)
Anion gap: 5 (ref 5–15)
BUN: 48 mg/dL — ABNORMAL HIGH (ref 8–23)
CO2: 15 mmol/L — ABNORMAL LOW (ref 22–32)
Calcium: 8.9 mg/dL (ref 8.9–10.3)
Chloride: 127 mmol/L — ABNORMAL HIGH (ref 98–111)
Creatinine, Ser: 1.97 mg/dL — ABNORMAL HIGH (ref 0.44–1.00)
GFR, Estimated: 23 mL/min — ABNORMAL LOW (ref >60)
Glucose, Bld: 80 mg/dL (ref 70–99)
Potassium: 3.5 mmol/L (ref 3.5–5.1)
Sodium: 147 mmol/L — ABNORMAL HIGH (ref 135–145)
Total Bilirubin: 0.7 mg/dL (ref 0.3–1.2)
Total Protein: 6.5 g/dL (ref 6.5–8.1)

## 2021-09-23 LAB — LIPASE, BLOOD: Lipase: 38 U/L (ref 11–51)

## 2021-09-23 MED ORDER — ACETAMINOPHEN 650 MG RE SUPP: 650.0000 mg | Freq: Four times a day (QID) | RECTAL | Status: DC | PRN

## 2021-09-23 MED ORDER — LACTATED RINGERS IV SOLN: INTRAVENOUS | Status: DC

## 2021-09-23 MED ORDER — ACETAMINOPHEN 325 MG PO TABS: 650.0000 mg | ORAL_TABLET | Freq: Four times a day (QID) | ORAL | Status: DC | PRN

## 2021-09-23 MED ORDER — POTASSIUM CHLORIDE CRYS ER 20 MEQ PO TBCR: 20.0000 meq | EXTENDED_RELEASE_TABLET | Freq: Two times a day (BID) | ORAL | Status: DC

## 2021-09-23 MED ORDER — LISINOPRIL 20 MG PO TABS: 20.0000 mg | ORAL_TABLET | Freq: Every day | ORAL | Status: DC

## 2021-09-23 MED ORDER — SODIUM CHLORIDE 0.9 % IV SOLN: Freq: Once | INTRAVENOUS | Status: AC

## 2021-09-23 MED ORDER — POLYETHYLENE GLYCOL 3350 17 G PO PACK
17.0000 g | PACK | Freq: Every day | ORAL | Status: DC | PRN
Start: 2021-09-23 — End: 2021-09-25

## 2021-09-23 MED ORDER — MIRTAZAPINE 15 MG PO TABS: 7.5000 mg | ORAL_TABLET | Freq: Every day | ORAL | Status: DC

## 2021-09-23 NOTE — ED Provider Notes (Signed)
[facility] Provider Note    Event Date/Time   First MD Initiated Contact with Patient 09/23/21 1607     (approximate)   History   abdominal distention   HPI  Sonya Craig is a 88 y.o. female who presents to the emergency department from living facility today because of concerns for abdominal distention and x-ray that showed potential blockage.  Patient herself does have some dementia so cannot give any significant history.  When asked stated that the distention has been going on for years.  However per triage note family noticed it starting yesterday.   Physical Exam   Triage Vital Signs: ED Triage Vitals  Enc Vitals Group     BP 09/23/21 1336 (!) 145/65     Pulse Rate 09/23/21 1336 68     Resp 09/23/21 1336 20     Temp 09/23/21 1336 98.3 F (36.8 C)     Temp Source 09/23/21 1336 Oral     SpO2 09/23/21 1336 100 %     Weight 09/23/21 1333 110 lb (49.9 kg)     Height 09/23/21 1333 5\' 3"  (1.6 m)     Head Circumference --      Peak Flow --      Pain Score 09/23/21 1333 0     Pain Loc --      Pain Edu? --      Excl. in [place]? --     Most recent vital signs: Vitals:   09/23/21 1336  BP: (!) 145/65  Pulse: 68  Resp: 20  Temp: 98.3 F (36.8 C)  SpO2: 100%    General: Awake, alert, not completely oriented. CV:  Good peripheral perfusion. Regular rate and rhythm. Resp:  Normal effort. Lungs clear. Abd:  Distention. Tympanitic.   ED Results / Procedures / Treatments   Labs (all labs ordered are listed, but only abnormal results are displayed) Labs Reviewed  COMPREHENSIVE METABOLIC PANEL - Abnormal; Notable for the following components:      Result Value   Sodium 147 (*)    Chloride 127 (*)    CO2 15 (*)    BUN 48 (*)    Creatinine, Ser 1.97 (*)    GFR, Estimated 23 (*)    All other components within normal limits  CBC - Abnormal; Notable for the following components:   RBC 3.69 (*)    Hemoglobin 11.5 (*)    All other components  within normal limits  LIPASE, BLOOD  URINALYSIS, ROUTINE W REFLEX MICROSCOPIC     EKG  None   RADIOLOGY I independently interpreted and visualized the abd x-ray. My interpretation: Distended bowel Radiology interpretation:  IMPRESSION:  Diffuse marked gaseous distention of small bowel and colon. Imaging  features are compatible with severe ileus although distal colonic  obstruction cannot be excluded.     PROCEDURES:  Critical Care performed: No  Procedures   MEDICATIONS ORDERED IN ED: Medications - No data to display   IMPRESSION / MDM / [place] / ED COURSE  I reviewed the triage vital signs and the nursing notes.                              Differential diagnosis includes, but is not limited to, ileus, obstruction, volvulus.  Patient's presentation is most consistent with acute presentation with potential threat to life or bodily function.  Patient presented to the emergency department today because of concerns  for abdominal distention.  Patient's family did come to the emergency department and was able to give slightly more history.  It sounds like the patient's abdomen will distend from time to time but then get better after the patient stops eating.  They stated that it is the worst they have ever seen.  The patient does have abdominal distention.  No significant tenderness.  CT scan is concerning for large dilation however no obvious obstruction.  Patient did not have fecal impaction on exam.  Discussed with surgery who recommended NG tube and n.p.o.  Discussed with Dr. Kennon Rounds with the hospitalist service who will plan on admission.   FINAL CLINICAL IMPRESSION(S) / ED DIAGNOSES   Final diagnoses:  Abdominal distention     Note:  This document was prepared using Dragon voice recognition software and may include unintentional dictation errors.    Nance Pear, MD 09/23/21 2051

## 2021-09-23 NOTE — Assessment & Plan Note (Signed)
Based on distended abdomen and CT findings. No pain and no N/V Will make NPO--Hold NGT placement

## 2021-09-23 NOTE — ED Triage Notes (Signed)
Pt to ED from [facility], [place]. Family at bedside.  Family member who is RN noticed abdomen was distended several days ago and told [facility] who had abdominal xray performed yesterday, revealing blockage. Abdomen does appear distended and taut. Pt denies pain at this time.

## 2021-09-23 NOTE — Assessment & Plan Note (Signed)
Continue Lisinopril 

## 2021-09-23 NOTE — Assessment & Plan Note (Signed)
Close to baseline.  Monitor kidney function  Lab Results  Component Value Date   CREATININE 1.79 (H) 09/24/2021   CREATININE 1.97 (H) 09/23/2021   CREATININE 1.22 (H) 04/22/2020

## 2021-09-23 NOTE — Assessment & Plan Note (Signed)
Continue lisinopril

## 2021-09-23 NOTE — H&P (Signed)
History and Physical    Patient: Sonya Craig:[id number] DOB: 10-Jun-1927 DOA: 09/23/2021 DOS: the patient was seen and examined on 09/23/2021 PCP: Kirk Ruths, MD  Patient coming from: [place] Chief Complaint:  Chief Complaint  Patient presents with   abdominal distention   HPI: Sonya Craig is a 88 y.o. female with medical history significant of T2DM, HTN, HLD, CKD stage III who occasionally developed significant abdominal distention.  Her family noticed that at the nursing home on yesterday.  Usually when this happens it resolves on its own.  The patient reports no significant surgical history. The ED surgery was curbside who reviewed her images and did not feel that she had a small bowel obstruction but more of a bad ileus.  Rectal exam did not show a significant stool impaction.  Surgery recommended NG tube n.p.o. and IV fluids.  She was noted to have acute on chronic kidney injury with a creatinine of 1.97.  Review of Systems: As mentioned in the history of present illness. All other systems reviewed and are negative. Past Medical History:  Diagnosis Date   Diabetes mellitus without complication ([place])    Hypercholesterolemia    Hypertension    Stage 3 chronic kidney disease due to type 2 diabetes mellitus ([place])    History reviewed. No pertinent surgical history. Social History:  reports that she has never smoked. She has never used smokeless tobacco. She reports current alcohol use. She reports that she does not use drugs.  No Known Allergies  History reviewed. No pertinent family history.  Prior to Admission medications   Medication Sig Start Date End Date Taking? Authorizing Provider  lisinopril (ZESTRIL) 20 MG tablet Take 1 tablet (20 mg total) by mouth daily. 04/28/20  Yes Lorella Nimrod, MD  mirtazapine (REMERON) 7.5 MG tablet Take 7.5 mg by mouth at bedtime. 06/06/19  Yes [provider]  potassium chloride SA (KLOR-CON M) 20 MEQ tablet Take 20  mEq by mouth 2 (two) times daily. 08/25/21  Yes [provider]  acetaminophen (TYLENOL) 325 MG tablet Take 2 tablets (650 mg total) by mouth every 6 (six) hours as needed for mild pain (or Fever >/= 101). 04/28/20   Lorella Nimrod, MD  aspirin 81 MG EC tablet Take 81 mg by mouth daily at 6 (six) AM. Patient not taking: Reported on 09/23/2021    [provider]  Diclofenac Sodium 1.5 % SOLN APPLY 40 DROPS TOPICALLY 4 TIMES A DAY TO AFFECTED AREA 02/24/20   [provider]  feeding supplement (ENSURE ENLIVE / ENSURE PLUS) LIQD Take 237 mLs by mouth 2 (two) times daily between meals. 04/28/20   Lorella Nimrod, MD  lovastatin (MEVACOR) 40 MG tablet Take 40 mg by mouth daily. Patient not taking: Reported on 09/23/2021 06/06/19   [provider]  ondansetron (ZOFRAN-ODT) 4 MG disintegrating tablet Take 1 tablet (4 mg total) by mouth every 6 (six) hours as needed for nausea. 04/28/20   Lorella Nimrod, MD    Physical Exam: Vitals:   09/23/21 1645 09/23/21 1700 09/23/21 1730 09/23/21 1840  BP: 134/70 138/62 (!) 138/46 126/79  Pulse: 67 67 67 75  Resp: 20  16 16   Temp:   98.9 F (37.2 C) 98.6 F (37 C)  TempSrc:   Oral Oral  SpO2: 100% 100% 100% 100%  Weight:      Height:       Physical Examination: General appearance - alert, well appearing, and in no distress and  chronically ill appearing Mouth - mucous membranes moist, pharynx normal without lesions and edentulous Neck - supple, no significant adenopathy Chest - clear to auscultation, no wheezes, rales or rhonchi, symmetric air entry Heart - normal rate, regular rhythm, normal S1, S2, no murmurs, rubs, clicks or gallops Abdomen -soft, quiet, markedly distended, nontender Extremities - peripheral pulses normal, 2-3+ pedal edema, no clubbing or cyanosis Skin - normal coloration and turgor, no rashes, no suspicious skin lesions noted  Data Reviewed:  Results for orders placed or performed during the hospital encounter of  09/23/21 (from the past 24 hour(s))  Lipase, blood     Status: None   Collection Time: 09/23/21  2:21 PM  Result Value Ref Range   Lipase 38 11 - 51 U/L  Comprehensive metabolic panel     Status: Abnormal   Collection Time: 09/23/21  2:21 PM  Result Value Ref Range   Sodium 147 (H) 135 - 145 mmol/L   Potassium 3.5 3.5 - 5.1 mmol/L   Chloride 127 (H) 98 - 111 mmol/L   CO2 15 (L) 22 - 32 mmol/L   Glucose, Bld 80 70 - 99 mg/dL   BUN 48 (H) 8 - 23 mg/dL   Creatinine, Ser 1.97 (H) 0.44 - 1.00 mg/dL   Calcium 8.9 8.9 - 10.3 mg/dL   Total Protein 6.5 6.5 - 8.1 g/dL   Albumin 3.7 3.5 - 5.0 g/dL   AST 24 15 - 41 U/L   ALT 11 0 - 44 U/L   Alkaline Phosphatase 52 38 - 126 U/L   Total Bilirubin 0.7 0.3 - 1.2 mg/dL   GFR, Estimated 23 (L) >60 mL/min   Anion gap 5 5 - 15  CBC     Status: Abnormal   Collection Time: 09/23/21  2:21 PM  Result Value Ref Range   WBC 4.2 4.0 - 10.5 K/uL   RBC 3.69 (L) 3.87 - 5.11 MIL/uL   Hemoglobin 11.5 (L) 12.0 - 15.0 g/dL   HCT 36.4 36.0 - 46.0 %   MCV 98.6 80.0 - 100.0 fL   MCH 31.2 26.0 - 34.0 pg   MCHC 31.6 30.0 - 36.0 g/dL   RDW 15.1 11.5 - 15.5 %   Platelets 185 150 - 400 K/uL   nRBC 0.0 0.0 - 0.2 %   CT ABDOMEN PELVIS WO CONTRAST  Result Date: 09/23/2021 CLINICAL DATA:  Abdominal distension, pain, loose bowel movements EXAM: CT ABDOMEN AND PELVIS WITHOUT CONTRAST TECHNIQUE: Multidetector CT imaging of the abdomen and pelvis was performed following the standard protocol without IV contrast. Unenhanced CT was performed per clinician order. Lack of IV contrast limits sensitivity and specificity, especially for evaluation of abdominal/pelvic solid viscera. RADIATION DOSE REDUCTION: This exam was performed according to the departmental dose-optimization program which includes automated exposure control, adjustment of the mA and/or kV according to patient size and/or use of iterative reconstruction technique. COMPARISON:  09/23/2021. FINDINGS: Lower chest: No  acute pleural or parenchymal lung disease. Hepatobiliary: Unremarkable unenhanced appearance of the liver and gallbladder. Pancreas: Unremarkable unenhanced appearance. Spleen: Unremarkable unenhanced appearance. Adrenals/Urinary Tract: No urinary tract calculi or obstructive uropathy within either kidney. The adrenals are grossly unremarkable. Bladder is moderately distended, with a large diverticulum off the left superolateral aspect. No filling defect. Stomach/Bowel: There is marked gaseous distension of the small bowel and colon through the level of the rectum. This is most pronounced within the sigmoid colon, which measures up to 13.3 cm in maximal diameter. Moderate retained stool and fluid  within the rectal vault. I do not see any obstructing lesion. No evidence of bowel wall thickening. Vascular/Lymphatic: Diffuse atherosclerosis of the aorta and its branches. No pathologic adenopathy within the abdomen or pelvis. Reproductive: Uterus is atrophic with multiple calcifications consistent with small calcified fibroids. Minimally complex right adnexal cyst identified, measuring 6.5 x 6.1 x 4.4 cm, with a single thin septation identified. The left ovary is not well visualized. Other: No free fluid or free intraperitoneal gas. Right inguinal hernia contains a small amount of fluid. No bowel herniation. There is extensive subcutaneous edema within the lower abdominal wall and proximal lower extremities. Musculoskeletal: No acute or destructive bony lesions. Reconstructed images demonstrate no additional findings. IMPRESSION: 1. Diffuse gaseous distension of the large and small bowel, to the level of the rectum. No evidence of obstructing lesion. Moderate retained stool in fluid within the rectal vault consistent with history of diarrhea. 2. Complex right adnexal cyst. If the patient would be a therapy candidate should neoplasm be detected, follow-up nonemergent outpatient pelvic ultrasound could be considered. 3.  Right inguinal hernia, with a small amount of fluid in the hernia sac. No bowel herniation. 4. Diffuse subcutaneous edema within the lower abdominal wall and visualized lower extremities. 5.  Aortic Atherosclerosis (ICD10-I70.0). Electronically Signed   By: Randa Ngo M.D.   On: 09/23/2021 16:52   DG Abdomen 1 View  Result Date: 09/23/2021 CLINICAL DATA:  Abdominal distension. EXAM: ABDOMEN - 1 VIEW COMPARISON:  None Available. FINDINGS: Supine view of the abdomen and pelvis shows diffuse markedly gas distended small bowel and colon. Prominent arterial vascular calcification evident. Numerous phleboliths are noted along the left pelvic sidewalls. Bones are diffusely demineralized. IMPRESSION: Diffuse marked gaseous distention of small bowel and colon. Imaging features are compatible with severe ileus although distal colonic obstruction cannot be excluded. Electronically Signed   By: Misty Stanley M.D.   On: 09/23/2021 14:21     Assessment and Plan: * Ileus ([place]) N.p.o IV hydration We will hold on NG tube as patient is not vomiting is not having pain.  CKD (chronic kidney disease), stage IV (HCC) Acute on chronic, baseline creatinine is 1.2. Avoid nephrotoxic agents Gentle IV hydration Trend  Essential hypertension Continue lisinopril     Advance Care Planning:   Code Status: Prior DNR  Consults: Surgery just by curbside  Family Communication: 2 daughters at bedside  Severity of Illness: The appropriate patient status for this patient is INPATIENT. Inpatient status is judged to be reasonable and necessary in order to provide the required intensity of service to ensure the patient's safety. The patient's presenting symptoms, physical exam findings, and initial radiographic and laboratory data in the context of their chronic comorbidities is felt to place them at high risk for further clinical deterioration. Furthermore, it is not anticipated that the patient will be medically stable  for discharge from the hospital within 2 midnights of admission.   * I certify that at the point of admission it is my clinical judgment that the patient will require inpatient hospital care spanning beyond 2 midnights from the point of admission due to high intensity of service, high risk for further deterioration and high frequency of surveillance required.*  Author: Donnamae Jude, MD 09/23/2021 7:04 PM  For on call review [email or web address].

## 2021-09-23 NOTE — ED Notes (Signed)
Patient to ED via [facility] from [facility] for abd pain. Abd is noted to be distended and having loose BM's. VS WNL.

## 2021-09-23 NOTE — ED Notes (Addendum)
Lab drawing labs at thist ime.

## 2021-09-23 NOTE — Assessment & Plan Note (Addendum)
Status post colonic decompression and colonoscopy.  Rectal decompression tube placed by GI. Appreciate GI and surgical assistance

## 2021-09-23 NOTE — ED Notes (Addendum)
Attempted to place NG tube x3. Family at bedside. Pt did not tolerate insertion. MD aware

## 2021-09-23 NOTE — Assessment & Plan Note (Signed)
Avoid nephrotoxic agents gentle IV hydration Trend

## 2021-09-24 ENCOUNTER — Encounter
Admission: EM | Disposition: A | Discharge status: Skilled Nursing Facility | Payer: Self-pay | Source: Skilled Nursing Facility | Attending: Internal Medicine

## 2021-09-24 ENCOUNTER — Inpatient Hospital Stay: Payer: Medicare Other

## 2021-09-24 ENCOUNTER — Inpatient Hospital Stay: Payer: Medicare Other | Admitting: General Practice

## 2021-09-24 ENCOUNTER — Encounter: Payer: Self-pay | Admitting: Family Medicine

## 2021-09-24 DIAGNOSIS — I10 Essential (primary) hypertension: Secondary | ICD-10-CM

## 2021-09-24 DIAGNOSIS — N184 Chronic kidney disease, stage 4 (severe): Secondary | ICD-10-CM

## 2021-09-24 DIAGNOSIS — K5939 Other megacolon: Secondary | ICD-10-CM

## 2021-09-24 DIAGNOSIS — K5981 Ogilvie syndrome: Secondary | ICD-10-CM

## 2021-09-24 DIAGNOSIS — E876 Hypokalemia: Secondary | ICD-10-CM | POA: Diagnosis not present

## 2021-09-24 DIAGNOSIS — K567 Ileus, unspecified: Secondary | ICD-10-CM | POA: Diagnosis not present

## 2021-09-24 DIAGNOSIS — E43 Unspecified severe protein-calorie malnutrition: Secondary | ICD-10-CM

## 2021-09-24 HISTORY — PX: COLONOSCOPY: SHX5424

## 2021-09-24 LAB — BASIC METABOLIC PANEL
Anion gap: 8 (ref 5–15)
Anion gap: 7 (ref 5–15)
BUN: 53 mg/dL — ABNORMAL HIGH (ref 8–23)
BUN: 43 mg/dL — ABNORMAL HIGH (ref 8–23)
CO2: 14 mmol/L — ABNORMAL LOW (ref 22–32)
CO2: 12 mmol/L — ABNORMAL LOW (ref 22–32)
Calcium: 8.8 mg/dL — ABNORMAL LOW (ref 8.9–10.3)
Calcium: 8.7 mg/dL — ABNORMAL LOW (ref 8.9–10.3)
Chloride: 127 mmol/L — ABNORMAL HIGH (ref 98–111)
Chloride: 127 mmol/L — ABNORMAL HIGH (ref 98–111)
Creatinine, Ser: 1.79 mg/dL — ABNORMAL HIGH (ref 0.44–1.00)
Creatinine, Ser: 1.83 mg/dL — ABNORMAL HIGH (ref 0.44–1.00)
GFR, Estimated: 26 mL/min — ABNORMAL LOW (ref >60)
GFR, Estimated: 26 mL/min — ABNORMAL LOW (ref >60)
Glucose, Bld: 58 mg/dL — ABNORMAL LOW (ref 70–99)
Glucose, Bld: 123 mg/dL — ABNORMAL HIGH (ref 70–99)
Potassium: 2.7 mmol/L — CL (ref 3.5–5.1)
Potassium: 3.8 mmol/L (ref 3.5–5.1)
Sodium: 149 mmol/L — ABNORMAL HIGH (ref 135–145)
Sodium: 146 mmol/L — ABNORMAL HIGH (ref 135–145)

## 2021-09-24 LAB — GLUCOSE, CAPILLARY
Glucose-Capillary: 127 mg/dL — ABNORMAL HIGH (ref 70–99)
Glucose-Capillary: 51 mg/dL — ABNORMAL LOW (ref 70–99)
Glucose-Capillary: 159 mg/dL — ABNORMAL HIGH (ref 70–99)
Glucose-Capillary: 54 mg/dL — ABNORMAL LOW (ref 70–99)

## 2021-09-24 LAB — MAGNESIUM: Magnesium: 2.2 mg/dL (ref 1.7–2.4)

## 2021-09-24 SURGERY — COLONOSCOPY
Anesthesia: General

## 2021-09-24 SURGERY — COLONOSCOPY WITH PROPOFOL
Anesthesia: General

## 2021-09-24 MED ORDER — DEXTROSE 50 % IV SOLN
25.0000 g | INTRAVENOUS | Status: AC
  Administered 2021-09-24: 25 g via INTRAVENOUS

## 2021-09-24 MED ORDER — POTASSIUM CHLORIDE 10 MEQ/100ML IV SOLN: 10.0000 meq | INTRAVENOUS | Status: DC

## 2021-09-24 MED ORDER — DEXTROSE 50 % IV SOLN
INTRAVENOUS | Status: AC
  Filled 2021-09-24: qty 50

## 2021-09-24 MED ORDER — POTASSIUM CHLORIDE 10 MEQ/100ML IV SOLN
10.0000 meq | INTRAVENOUS | Status: AC
  Administered 2021-09-24 (×2): 10 meq via INTRAVENOUS
  Filled 2021-09-24 (×2): qty 100

## 2021-09-24 MED ORDER — POTASSIUM CHLORIDE CRYS ER 20 MEQ PO TBCR: 40.0000 meq | EXTENDED_RELEASE_TABLET | Freq: Once | ORAL | Status: DC

## 2021-09-24 MED ORDER — PROPOFOL 500 MG/50ML IV EMUL
INTRAVENOUS | Status: DC | PRN
  Administered 2021-09-24: 50 ug/kg/min via INTRAVENOUS

## 2021-09-24 MED ORDER — SODIUM CHLORIDE 0.9 % IV SOLN: INTRAVENOUS | Status: DC | PRN

## 2021-09-24 MED ORDER — POTASSIUM CHLORIDE CRYS ER 20 MEQ PO TBCR
20.0000 meq | EXTENDED_RELEASE_TABLET | Freq: Two times a day (BID) | ORAL | Status: DC
  Administered 2021-09-24 – 2021-09-25 (×2): 20 meq via ORAL
  Filled 2021-09-24 (×2): qty 1

## 2021-09-24 MED ORDER — DEXTROSE 5 % IV SOLN: INTRAVENOUS | Status: DC

## 2021-09-24 MED ORDER — PROPOFOL 10 MG/ML IV BOLUS
INTRAVENOUS | Status: DC | PRN
  Administered 2021-09-24: 30 mg via INTRAVENOUS
  Administered 2021-09-24: 20 mg via INTRAVENOUS
  Administered 2021-09-24: 30 mg via INTRAVENOUS

## 2021-09-24 MED ORDER — DEXTROSE 50 % IV SOLN
25.0000 mL | Freq: Once | INTRAVENOUS | Status: AC
  Administered 2021-09-24: 25 mL via INTRAVENOUS
  Filled 2021-09-24: qty 50

## 2021-09-24 MED ORDER — PROPOFOL 10 MG/ML IV BOLUS
INTRAVENOUS | Status: AC
  Filled 2021-09-24: qty 40

## 2021-09-24 NOTE — Assessment & Plan Note (Signed)
Replete and recheck, check magnesium 

## 2021-09-24 NOTE — Consult Note (Signed)
Patient ID: Sonya Craig, female   DOB: [birth year], 88 y.o.   MRN: [id number]  HPI Sonya Craig is a 88 y.o. female seen in consultation at the request of Dr. Manuella Ghazi.  She came in last night after daughter noticed abdominal distention at [place] home.  Apparently they did an x-ray at our facility and told her to come to the emergency room.  Of note she endorses no abdominal pain.  She does report some watery bowel movements.  No nausea or vomiting.  Multiple attempts to place NG tube were unsuccessful. She does have a significant history of diabetes hypertension chronic kidney disease stage III.  Over the last few months she has had significant decline in her mobility and is in [place].  Her mind is still pristine and she is able to answer his questions and is competent.  Her daughter is a Marine scientist on the first floor here at [facility]. She did have a CT scan that have personally reviewed showing evidence of a large bowel distention from the sigmoid colon proximally.  There is no evidence of a definitive rectal mass.  The sigmoid is up to 13.3 cm. I also have reviewed an x-ray this morning shows persistent dilation of the bowel.  This is primarily the large bowel. Her labs reveal a sodium of 149 and a potassium of 2.7 with a chloride of 03/23/2025 and a CO2 of 14.  Creatinine of 1.79 Hemoglobin of 11.5 and white count of 4.2. 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 No pertinent surgical history.  History reviewed. No pertinent family history.  Social History Social History   Tobacco Use   Smoking status: Never   Smokeless tobacco: Never  Substance Use Topics   Alcohol use: Yes    Comment: occasionally   Drug use: Never     No Known Allergies  Current Facility-Administered Medications  Medication Dose Route Frequency Provider Last Rate Last Admin   [MAR Hold] acetaminophen (TYLENOL) tablet 650 mg  650 mg Oral Q6H PRN Donnamae Jude, MD       Or   [facility] Hold] acetaminophen (TYLENOL) suppository 650 mg  650 mg Rectal Q6H PRN Donnamae Jude, MD       dextrose 50 % solution            lactated ringers infusion   Intravenous Continuous Donnamae Jude, MD   Stopped at 09/24/21 1113   [MAR Hold] lisinopril (ZESTRIL) tablet 20 mg  20 mg Oral Daily Donnamae Jude, MD       [MAR Hold] mirtazapine (REMERON) tablet 7.5 mg  7.5 mg Oral QHS Donnamae Jude, MD       [facility] Hold] polyethylene glycol (MIRALAX / GLYCOLAX) packet 17 g  17 g Oral Daily PRN Donnamae Jude, MD       [facility] Hold] potassium chloride SA (KLOR-CON M) CR tablet 20 mEq  20 mEq Oral BID Athena Masse, MD       Facility-Administered Medications Ordered in Other Encounters  Medication Dose Route Frequency Provider Last Rate Last Admin   propofol (DIPRIVAN) 10 mg/mL bolus/IV push   Intravenous Anesthesia Intra-op Garner Nash, CRNA   30 mg  at 09/24/21 1116   propofol (DIPRIVAN) 500 MG/50ML infusion   Intravenous Continuous PRN Garner Nash, CRNA 14.97 mL/hr at 09/24/21 1116 50 mcg/kg/min at 09/24/21 1116     Review of Systems Full ROS  was asked and was negative except for the information on the HPI  Physical Exam Blood pressure 139/60, pulse 68, temperature 97.8 F (36.6 C), resp. rate 16, height 5\' 3"  (1.6 m), weight 49.9 kg, SpO2 100 %. CONSTITUTIONAL: Debilitated but in no acute distress malnourished. EYES: Pupils are equal, round,  Sclera are non-icteric. EARS, NOSE, MOUTH AND THROAT: TThe oral mucosa is pink and moist. Hearing is intact to voice. LYMPH NODES:  Lymph nodes in the neck are normal. RESPIRATORY:  Lungs are clear. There is normal respiratory effort, with equal breath sounds bilaterally, and without pathologic use of  accessory muscles. CARDIOVASCULAR: Heart is regular without murmurs, gallops, or rubs. GI: The abdomen is  soft, non tender but significant distention .there are no palpable masses. There is no hepatosplenomegaly. There are decreased bowel sounds Rectal: Increased tone on rectal sphincter.  Was tender to palpation but there is no evidence of abscess there is no evidence of necrotizing infection or thrombosed hemorrhoid to explain the pain.  After rectal exam she decompressed some.  There was no rectal masses.   MUSCULOSKELETAL: Normal muscle strength and tone. No cyanosis or edema.   SKIN: Turgor is good and there are no pathologic skin lesions or ulcers. NEUROLOGIC: Motor and sensation is grossly normal. Cranial nerves are grossly intact. PSYCH:  Oriented to person, place and time. Affect is normal.  Data Reviewed  I have personally reviewed the patient's imaging, laboratory findings and medical records.    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 No evidence of mechanical obstruction.  Sigmoid dilated up to 13.3 cm.  She is not peritonitic nor toxic but definitely will need some sort of decompression either pharmaceutical or potentially with a rectal tube or endoscopic decompression. I have discussed the case in detail with Dr.Wohl, who has agreed to see the patient in consultation and determine best route for decompression. I perfectly understand the high recurrence rate for endoscopic decompression but at this time we are just trying to avoid a perforation that will be a devastating event.  Her daughter is very realistic and does not want to pursue any major surgical options such as a Hartman's likely will be required if she perforates. Also recommend removing the Remeron ( home) and replacing electrolytes appropriately as well as avoiding any narcotics Please note that I spent greater than 75 minutes's encounter including  personally reviewing imaging studies, coordinating her care, placing orders and performing appropriate mentation.     Caroleen Hamman, MD FACS General Surgeon 09/24/2021, 11:30 AM

## 2021-09-24 NOTE — Progress Notes (Signed)
Blood glucose of 54 D50 amp given via IV and patient taken to Endo for procedure, Endo nurse notified.

## 2021-09-24 NOTE — Hospital Course (Addendum)
88 y.o. 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  Status post colonoscopy and colonic decompression.  Rectal decompression tube placed 8/6: Hypokalemia/hypomagnesemia 8/7: Rectal tube removed, replacement of electrolytes

## 2021-09-24 NOTE — Anesthesia Preprocedure Evaluation (Signed)
Anesthesia Evaluation  Patient identified by MRN, date of birth, ID band Patient awake    Reviewed: Allergy & Precautions, NPO status , Patient's Chart, lab work & pertinent test results  History of Anesthesia Complications Negative for: history of anesthetic complications  Airway Mallampati: Unable to assess  TM Distance: >3 FB Neck ROM: full    Dental  (+) Missing   Pulmonary neg shortness of breath, neg recent URI,    Pulmonary exam normal        Cardiovascular hypertension, (-) angina+ CAD  (-) Past MI and (-) CABG Normal cardiovascular exam     Neuro/Psych PSYCHIATRIC DISORDERS negative neurological ROS     GI/Hepatic negative GI ROS, Neg liver ROS,   Endo/Other  negative endocrine ROSdiabetes  Renal/GU Renal disease  negative genitourinary   Musculoskeletal   Abdominal   Peds  Hematology negative hematology ROS (+)   Anesthesia Other Findings Patient has a potassium of 2.7 today. Surgeon declared the case as emergent. Discussed with patient's daughter the risks of anesthesia and the increased risk of arrhythmia. The daughter stated she understood and agree with anesthesia today.  Past Medical History: No date: Diabetes mellitus without complication (HCC) No date: Hypercholesterolemia No date: Hypertension No date: Stage 3 chronic kidney disease due to type 2 diabetes  mellitus ([place])  History reviewed. No pertinent surgical history.  BMI    Body Mass Index: 19.49 kg/m      Reproductive/Obstetrics negative OB ROS                             Anesthesia Physical Anesthesia Plan  ASA: 3 and emergent  Anesthesia Plan: General   Post-op Pain Management: Minimal or no pain anticipated   Induction: Intravenous  PONV Risk Score and Plan: 3 and Propofol infusion, TIVA and Ondansetron  Airway Management Planned: Nasal Cannula  Additional Equipment: None  Intra-op Plan:    Post-operative Plan:   Informed Consent: I have reviewed the patients History and Physical, chart, labs and discussed the procedure including the risks, benefits and alternatives for the proposed anesthesia with the patient or authorized representative who has indicated his/her understanding and acceptance.     Dental advisory given and Consent reviewed with POA  Plan Discussed with: CRNA and Surgeon  Anesthesia Plan Comments: (Discussed risks of anesthesia with patient, including possibility of difficulty with spontaneous ventilation under anesthesia necessitating airway intervention, PONV, and rare risks such as cardiac or respiratory or neurological events, and allergic reactions. Discussed the role of CRNA in patient's perioperative care. Patient understands.)        Anesthesia Quick Evaluation

## 2021-09-24 NOTE — Op Note (Signed)
[facility] Patient Name: Sonya Craig Procedure Date: 09/24/2021 10:51 AM MRN: [id number] Account #: [id number] Date of Birth: December 05, 1927 Admit Type: Inpatient Age: 88 Room: [facility] ENDO ROOM 1 Gender: Female Note Status: Finalized Instrument Name: Jasper Riling [id number] Procedure:             Colonoscopy Indications:           Decompression of acute non-toxic megacolon Providers:             [name] MD, MD Referring MD:          Ocie Cornfield. Ouida Sills MD, MD (Referring MD) Medicines:             Propofol per Anesthesia Complications:         No immediate complications. Procedure:             Pre-Anesthesia Assessment:                        - Prior to the procedure, a History and Physical was                         performed, and patient medications and allergies were                         reviewed. The patient's tolerance of previous                         anesthesia was also reviewed. The risks and benefits                         of the procedure and the sedation options and risks                         were discussed with the patient. All questions were                         answered, and informed consent was obtained. Prior                         Anticoagulants: The patient has taken no previous                         anticoagulant or antiplatelet agents. ASA Grade                         Assessment: III - A patient with severe systemic                         disease. After reviewing the risks and benefits, the                         patient was deemed in satisfactory condition to                         undergo the procedure.                        After obtaining informed consent, the colonoscope was  passed under direct vision. Throughout the procedure,                         the patient's blood pressure, pulse, and oxygen                         saturations were monitored continuously. The                          Colonoscope was introduced through the anus with the                         intention of advancing to the splenic flexure. The                         scope was advanced to the descending colon before the                         procedure was aborted. Medications were given. The                         colonoscopy was performed without difficulty. The                         patient tolerated the procedure well. The quality of                         the bowel preparation was poor. Findings:      The perianal and digital rectal examinations were normal.      The lumen of the sigmoid colon was significantly dilated. colonic       decompression A rectal decompression tube was placed. Impression:            - Preparation of the colon was poor.                        - Dilated in the sigmoid colon.                        - No specimens collected. Recommendation:        - Return patient to hospital ward for ongoing care.                        - Clear liquid diet.                        - Continue present medications. Procedure Code(s):     --- Professional ---                        [phone], 34, Colonoscopy, flexible; with decompression                         (for pathologic distention) (eg, volvulus, megacolon),                         including placement of decompression tube, when                         performed Diagnosis Code(s):     ---  Professional ---                        K59.39, Other megacolon CPT copyright 2019 American Medical Association. All rights reserved. The codes documented in this report are preliminary and upon coder review may  be revised to meet current compliance requirements. [name] MD, MD 09/24/2021 12:10:51 PM This report has been signed electronically. Number of Addenda: 0 Note Initiated On: 09/24/2021 10:51 AM Total Procedure Duration: 0 hours 14 minutes 25 seconds  Estimated Blood Loss:  Estimated blood loss: none.      [facility]

## 2021-09-24 NOTE — Consult Note (Signed)
Sonya Lame, MD [facility]  [address], [place], [place] [address] Phone: [phone] Fax : [phone]  Consultation  Referring Provider:     Dr. Dahlia Craig Primary Care Physician:  Sonya Ruths, MD Primary Gastroenterologist:      [facility]     Reason for Consultation:     Colonic pseudoobstruction  Date of Admission:  09/23/2021 Date of Consultation:  09/24/2021         HPI:   Sonya Craig is a 88 y.o. female who is being seen as an urgent consultation by Dr. Dahlia Craig for colonic ileus.  The patient was admitted with abdominal distention and was found on a CT scan to have a distended colon at 13.3 cm.  The patient was seen by surgery who recommended a GI consult. The patient is not a great historian but denies any abdominal pain but continues to have abdominal distention.  Past Medical History:  Diagnosis Date   Diabetes mellitus without complication ([place])    Hypercholesterolemia    Hypertension    Stage 3 chronic kidney disease due to type 2 diabetes mellitus ([place])     History reviewed. No pertinent surgical history.  Prior to Admission medications   Medication Sig Start Date End Date Taking? Authorizing Provider  lisinopril (ZESTRIL) 20 MG tablet Take 1 tablet (20 mg total) by mouth daily. 04/28/20  Yes Sonya Nimrod, MD  mirtazapine (REMERON) 7.5 MG tablet Take 7.5 mg by mouth at bedtime. 06/06/19  Yes [provider]  potassium chloride SA (KLOR-CON M) 20 MEQ tablet Take 20 mEq by mouth 2 (two) times daily. 08/25/21  Yes [provider]  acetaminophen (TYLENOL) 325 MG tablet Take 2 tablets (650 mg total) by mouth every 6 (six) hours as needed for mild pain (or Fever >/= 101). 04/28/20   Sonya Nimrod, MD  aspirin 81 MG EC tablet Take 81 mg by mouth daily at 6 (six) AM. Patient not taking: Reported on 09/23/2021    [provider]  Diclofenac Sodium 1.5 % SOLN APPLY 40 DROPS TOPICALLY 4 TIMES A DAY TO AFFECTED AREA 02/24/20   [provider]  feeding supplement (ENSURE ENLIVE / ENSURE PLUS) LIQD Take 237 mLs by mouth 2 (two) times daily between meals. 04/28/20   Sonya Nimrod, MD  lovastatin (MEVACOR) 40 MG tablet Take 40 mg by mouth daily. Patient not taking: Reported on 09/23/2021 06/06/19   [provider]  ondansetron (ZOFRAN-ODT) 4 MG disintegrating tablet Take 1 tablet (4 mg total) by mouth every 6 (six) hours as needed for nausea. 04/28/20   Sonya Nimrod, MD    History reviewed. No pertinent family history.   Social History   Tobacco Use   Smoking status: Never   Smokeless tobacco: Never  Substance Use Topics   Alcohol use: Yes    Comment: occasionally   Drug use: Never    Allergies as of 09/23/2021   (No Known Allergies)    Review of Systems:    All systems reviewed and negative except where noted in HPI.   Physical Exam:  Vital signs in last 24 hours: Temp:  [97.8 F (36.6 C)-98.9 F (37.2 C)] 97.8 F (36.6 C) (08/05 1006) Pulse Rate:  [66-75] 68 (08/05 1006) Resp:  [16-21] 16 (08/05 1006) BP: (126-145)/(46-79) 139/60 (08/05 1006) SpO2:  [100 %] 100 % (08/05 1006) Weight:  [49.9 kg] 49.9 kg (08/04 1333) Last BM Date : 09/23/21 General:   Pleasant, cooperative in NAD Head:  Normocephalic and atraumatic. Eyes:   No icterus.   Conjunctiva pink. PERRLA. Ears:  Normal auditory acuity. Neck:  Supple; no masses or thyroidomegaly Lungs: Respirations even and unlabored. Lungs clear to auscultation bilaterally.   No wheezes, crackles, or rhonchi.  Heart:  Regular rate and rhythm;  Without murmur, clicks, rubs or gallops Abdomen:  Soft, positive distention, nontender.  Decreased bowel sounds. No appreciable masses or hepatomegaly.  No rebound or guarding.  Rectal:  Not performed. Msk:  Symmetrical without gross deformities.    Extremities:  Without edema, cyanosis or clubbing. Neurologic:  Alert and oriented x3;  grossly normal neurologically. Skin:  Intact without significant lesions or  rashes. Cervical Nodes:  No significant cervical adenopathy. Psych:  Alert and cooperative. Normal affect.  LAB RESULTS: Recent Labs    09/23/21 1421  WBC 4.2  HGB 11.5*  HCT 36.4  PLT 185   BMET Recent Labs    09/23/21 1421 09/24/21 0443  NA 147* 149*  K 3.5 2.7*  CL 127* 127*  CO2 15* 14*  GLUCOSE 80 58*  BUN 48* 53*  CREATININE 1.97* 1.79*  CALCIUM 8.9 8.8*   LFT Recent Labs    09/23/21 1421  PROT 6.5  ALBUMIN 3.7  AST 24  ALT 11  ALKPHOS 52  BILITOT 0.7   PT/INR No results for input(s): "LABPROT", "INR" in the last 72 hours.  STUDIES: DG Abd 2 Views  Result Date: 09/24/2021 CLINICAL DATA:  Abdominal distension. EXAM: ABDOMEN - 2 VIEW COMPARISON:  09/23/2021 FINDINGS: Marked gaseous distention of small bowel and colon again noted without substantial change although there may be minimally decreased distension of small bowel. Bones are diffusely demineralized. IMPRESSION: Persistent marked gaseous distention of small bowel and colon without substantial change although minimal decrease in small bowel distension. Electronically Signed   By: Misty Stanley M.D.   On: 09/24/2021 09:22   CT ABDOMEN PELVIS WO CONTRAST  Result Date: 09/23/2021 CLINICAL DATA:  Abdominal distension, pain, loose bowel movements EXAM: CT ABDOMEN AND PELVIS WITHOUT CONTRAST TECHNIQUE: Multidetector CT imaging of the abdomen and pelvis was performed following the standard protocol without IV contrast. Unenhanced CT was performed per clinician order. Lack of IV contrast limits sensitivity and specificity, especially for evaluation of abdominal/pelvic solid viscera. RADIATION DOSE REDUCTION: This exam was performed according to the departmental dose-optimization program which includes automated exposure control, adjustment of the mA and/or kV according to patient size and/or use of iterative reconstruction technique. COMPARISON:  09/23/2021. FINDINGS: Lower chest: No acute pleural or parenchymal lung  disease. Hepatobiliary: Unremarkable unenhanced appearance of the liver and gallbladder. Pancreas: Unremarkable unenhanced appearance. Spleen: Unremarkable unenhanced appearance. Adrenals/Urinary Tract: No urinary tract calculi or obstructive uropathy within either kidney. The adrenals are grossly unremarkable. Bladder is moderately distended, with a large diverticulum off the left superolateral aspect. No filling defect. Stomach/Bowel: There is marked gaseous distension of the small bowel and colon through the level of the rectum. This is most pronounced within the sigmoid colon, which measures up to 13.3 cm in maximal diameter. Moderate retained stool and fluid within the rectal vault. I do not see any obstructing lesion. No evidence of bowel wall thickening. Vascular/Lymphatic: Diffuse atherosclerosis of the aorta and its branches. No pathologic adenopathy within the abdomen or pelvis. Reproductive: Uterus is atrophic with multiple calcifications consistent with small calcified fibroids. Minimally complex right adnexal cyst identified, measuring 6.5 x 6.1 x 4.4 cm, with a single thin septation identified. The left ovary is not well visualized.  Other: No free fluid or free intraperitoneal gas. Right inguinal hernia contains a small amount of fluid. No bowel herniation. There is extensive subcutaneous edema within the lower abdominal wall and proximal lower extremities. Musculoskeletal: No acute or destructive bony lesions. Reconstructed images demonstrate no additional findings. IMPRESSION: 1. Diffuse gaseous distension of the large and small bowel, to the level of the rectum. No evidence of obstructing lesion. Moderate retained stool in fluid within the rectal vault consistent with history of diarrhea. 2. Complex right adnexal cyst. If the patient would be a therapy candidate should neoplasm be detected, follow-up nonemergent outpatient pelvic ultrasound could be considered. 3. Right inguinal hernia, with a small  amount of fluid in the hernia sac. No bowel herniation. 4. Diffuse subcutaneous edema within the lower abdominal wall and visualized lower extremities. 5.  Aortic Atherosclerosis (ICD10-I70.0). Electronically Signed   By: Randa Ngo M.D.   On: 09/23/2021 16:52   DG Abdomen 1 View  Result Date: 09/23/2021 CLINICAL DATA:  Abdominal distension. EXAM: ABDOMEN - 1 VIEW COMPARISON:  None Available. FINDINGS: Supine view of the abdomen and pelvis shows diffuse markedly gas distended small bowel and colon. Prominent arterial vascular calcification evident. Numerous phleboliths are noted along the left pelvic sidewalls. Bones are diffusely demineralized. IMPRESSION: Diffuse marked gaseous distention of small bowel and colon. Imaging features are compatible with severe ileus although distal colonic obstruction cannot be excluded. Electronically Signed   By: Misty Stanley M.D.   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  Plan:  The patient was brought down to the endoscopy unit and a colonoscopy was performed with decompression and significant decrease in abdominal distention.  At the end of the colonoscopy a rectal tube was placed for further decompression.  I have discussed the plan with the patient's daughter and the patient.  I would continue to monitor the patient's abdomen for further distention and avoid medications that can decrease colonic motility like the Remeron she is on presently.  Thank you for involving me in the care of this patient.      LOS: 1 day   Sonya Lame, MD, [facility] 09/24/2021, 11:54 AM,  Pager [phone] 7am-5pm  Check AMION for 5pm -7am coverage and on weekends   Note: This dictation was  prepared with Dragon dictation along with smaller phrase technology. Any transcriptional errors that result from this process are unintentional.

## 2021-09-24 NOTE — Plan of Care (Addendum)
Cross coverage note  K+ of 2.7 and CBG of 51.  Patient n.p.o. due to ileus K+ 10 mEq riders x2 and half amp of D50 ordered. LR being hung at this time Recheck CBG every x2 and recheck potassium at 12:00

## 2021-09-24 NOTE — Progress Notes (Signed)
  Progress Note   Patient: Sonya Craig WVP:[id number] DOB: 1927/05/08 DOA: 09/23/2021     1 DOS: the patient was seen and examined on 09/24/2021   Brief hospital course: 88 y.o. female with medical history significant of T2DM, HTN, HLD, CKD stage IIIa admitted for large bowel pseudoobstruction/Ogilvie syndrome  8/5: GI and surgical consult.  Status post colonoscopy and colonic decompression.  Rectal decompression tube placed   Assessment and Plan: * Ogilvie syndrome Status post colonic decompression and colonoscopy.  Rectal decompression tube placed by GI. Appreciate GI and surgical assistance  Hypokalemia Replete and recheck, check magnesium  Protein-calorie malnutrition, severe Encourage oral nutrition  CKD (chronic kidney disease), stage IV ([place]) Close to baseline.  Monitor kidney function  Lab Results  Component Value Date   CREATININE 1.79 (H) 09/24/2021   CREATININE 1.97 (H) 09/23/2021   CREATININE 1.22 (H) 04/22/2020    Essential hypertension Continue lisinopril.        Subjective: Very distended abdomen.  Complaining of uncomfortable feeling  Physical Exam: Vitals:   09/24/21 1154 09/24/21 1201 09/24/21 1211 09/24/21 1221  BP: (!) 142/68 (!) 125/94 (!) 172/88 (!) 175/77  Pulse: 75 75 78 76  Resp:  16 18 16   Temp:      TempSrc:      SpO2: 100% 98% 100% 100%  Weight:      Height:       88 year old female lying in the bed very uncomfortable due to colonic distention Lungs decreased breath sounds at the bases Cardiovascular regular rate and rhythm Abdomen distended abdomen, hypoactive bowel sounds, no tenderness guarding or rigidity Skin no rash or lesion Neuro alert and oriented, nonfocal Psych normal mood and affect Data Reviewed:  Sodium 149, potassium 2.7, creatinine 1.79  Family Communication: Daughter updated at bedside  Disposition: Status is: Inpatient Remains inpatient appropriate because: Colonic dilatation management   Planned  Discharge Destination: Home/[place]    DVT prophylaxis-SCDs Time spent: 35 minutes  Author: Max Sane, MD 09/24/2021 12:44 PM  For on call review [email or web address].

## 2021-09-24 NOTE — Assessment & Plan Note (Signed)
Encourage oral nutrition

## 2021-09-24 NOTE — NC FL2 (Signed)
[place] LEVEL OF CARE SCREENING TOOL     IDENTIFICATION  Patient Name: Sonya Craig Birthdate: 1928-02-13 Sex: female Admission Date (Current Location): 09/23/2021  [facility] and [place] Number:  Engineering geologist and Address:  [facility], [address] [place], [place] [address]      Provider Number: [id number]  Attending Physician Name and Address:  Max Sane, MD  Relative Name and Phone Number:  Wilder Glade    Current Level of Care: Hospital Recommended Level of Care: [place] Prior Approval Number:    Date Approved/Denied:   PASRR Number: [id number]  Discharge Plan: SNF    Current Diagnoses: Patient Active Problem List   Diagnosis Date Noted   Ileus ([place]) 09/23/2021   Delirium 04/27/2020   AKI (acute kidney injury) ([place]) 04/24/2020   CKD (chronic kidney disease), stage III ([place]) 04/24/2020   Protein-calorie malnutrition, severe 04/19/2020   Hypercalcemia 04/17/2020   Essential hypertension 04/17/2020   CKD (chronic kidney disease), stage IV ([place]) 04/17/2020   At risk for dehydration due to poor fluid intake 04/17/2020   Protein malnutrition ([place]) 04/17/2020   Debility 04/17/2020   Carotid artery disease ([place]) 09/30/2018   Pure hypercholesterolemia 12/03/2013   OP (osteoporosis) 11/16/2013    Orientation RESPIRATION BLADDER Height & Weight     Self, Time, Place  Normal Incontinent Weight: 49.9 kg Height:  5\' 3"  (160 cm)  BEHAVIORAL SYMPTOMS/MOOD NEUROLOGICAL BOWEL NUTRITION STATUS      Incontinent    AMBULATORY STATUS COMMUNICATION OF NEEDS Skin   Total Care   Normal                       Personal Care Assistance Level of Assistance  Total care       Total Care Assistance: Maximum assistance   Functional Limitations Info             SPECIAL CARE FACTORS FREQUENCY                       Contractures Contractures Info: Not present    Additional Factors Info   Code Status, Allergies Code Status Info: DNR Allergies Info: NKA           Current Medications (09/24/2021):  This is the current hospital active medication list Current Facility-Administered Medications  Medication Dose Route Frequency Provider Last Rate Last Admin   acetaminophen (TYLENOL) tablet 650 mg  650 mg Oral Q6H PRN Donnamae Jude, MD       Or   acetaminophen (TYLENOL) suppository 650 mg  650 mg Rectal Q6H PRN Donnamae Jude, MD       dextrose 50 % solution 25 g  25 g Intravenous STAT Max Sane, MD       lactated ringers infusion   Intravenous Continuous Donnamae Jude, MD 75 mL/hr at 09/24/21 1048 Continued from Pre-op at 09/24/21 1048   lisinopril (ZESTRIL) tablet 20 mg  20 mg Oral Daily Donnamae Jude, MD       mirtazapine (REMERON) tablet 7.5 mg  7.5 mg Oral QHS Donnamae Jude, MD       polyethylene glycol (MIRALAX / GLYCOLAX) packet 17 g  17 g Oral Daily PRN Donnamae Jude, MD       potassium chloride 10 mEq in 100 mL IVPB  10 mEq Intravenous Q1 Hr x 2 Judd Gaudier V, MD 100 mL/hr at 09/24/21 1003 10 mEq at  09/24/21 1003   potassium chloride SA (KLOR-CON M) CR tablet 20 mEq  20 mEq Oral BID Athena Masse, MD         Discharge Medications: Please see discharge summary for a list of discharge medications.  Relevant Imaging Results:  Relevant Lab Results:   Additional Information SS# [id number]  Valente David, RN

## 2021-09-24 NOTE — Plan of Care (Signed)
  Problem: Education: Goal: Knowledge of General Education information will improve Description: Including pain rating scale, medication(s)/side effects and non-pharmacologic comfort measures Outcome: Not Progressing   Problem: Health Behavior/Discharge Planning: Goal: Ability to manage health-related needs will improve Outcome: Not Progressing   Problem: Clinical Measurements: Goal: Ability to maintain clinical measurements within normal limits will improve Outcome: Progressing Goal: Will remain free from infection Outcome: Progressing Goal: Diagnostic test results will improve Outcome: Progressing Goal: Respiratory complications will improve Outcome: Progressing Goal: Cardiovascular complication will be avoided Outcome: Progressing   Problem: Activity: Goal: Risk for activity intolerance will decrease Outcome: Progressing   Problem: Coping: Goal: Level of anxiety will decrease Outcome: Progressing   Problem: Elimination: Goal: Will not experience complications related to bowel motility Outcome: Progressing Goal: Will not experience complications related to urinary retention Outcome: Progressing   Problem: Pain Managment: Goal: General experience of comfort will improve Outcome: Progressing   Problem: Safety: Goal: Ability to remain free from injury will improve Outcome: Progressing   Problem: Skin Integrity: Goal: Risk for impaired skin integrity will decrease Outcome: Progressing   

## 2021-09-24 NOTE — Transfer of Care (Signed)
Immediate Anesthesia Transfer of Care Note  Patient: Sonya Craig  Procedure(s) Performed: COLONOSCOPY/BOWEL DECOMPRESSION  Patient Location: PACU and Endoscopy Unit  Anesthesia Type:General  Level of Consciousness: drowsy  Airway & Oxygen Therapy: Patient connected to nasal cannula oxygen  Post-op Assessment: Report given to RN  Post vital signs: stable  Last Vitals:  Vitals Value Taken Time  BP 144/72   Temp    Pulse 75   Resp 14   SpO2 100     Last Pain:  Vitals:   09/23/21 1956  TempSrc:   PainSc: 0-No pain         Complications: No notable events documented.

## 2021-09-24 NOTE — Anesthesia Postprocedure Evaluation (Signed)
Anesthesia Post Note  Patient: Sonya Craig  Procedure(s) Performed: COLONOSCOPY/BOWEL DECOMPRESSION  Patient location during evaluation: Endoscopy Anesthesia Type: General Level of consciousness: awake and alert Pain management: pain level controlled Vital Signs Assessment: post-procedure vital signs reviewed and stable Respiratory status: spontaneous breathing, nonlabored ventilation, respiratory function stable and patient connected to nasal cannula oxygen Cardiovascular status: blood pressure returned to baseline and stable Postop Assessment: no apparent nausea or vomiting Anesthetic complications: no   No notable events documented.   Last Vitals:  Vitals:   09/24/21 1211 09/24/21 1221  BP: (!) 172/88 (!) 175/77  Pulse: 78 76  Resp: 18 16  Temp:    SpO2: 100% 100%    Last Pain:  Vitals:   09/24/21 1221  TempSrc:   PainSc: 0-No pain                 Dimas Millin

## 2021-09-24 NOTE — TOC Initial Note (Addendum)
Transition of Care [facility]) - Initial/Assessment Note    Patient Details  Name: Sonya Craig MRN: [id number] Date of Birth: 1927/05/15  [facility] CM/SW Contact:    Sonya David, RN Phone Number: 09/24/2021, 10:40 AM  Clinical Narrative:                 Spoke with daughter, confirmed patient currently resides in [place], plan to return.  Sonya Craig at [facility] confirmed she will return.  Will need EMS transportation when medically ready for discharge.    Expected Discharge Plan: Skilled Nursing Facility Barriers to Discharge: Continued Medical Work up   Patient Goals and CMS Choice Patient states their goals for this hospitalization and ongoing recovery are:: Per daughter, return to [facility] [email or web address] Compare Post Acute Care list provided to:: Patient Represenative (must comment) (Daughter Sonya Craig) Choice offered to / list presented to : Adult Children  Expected Discharge Plan and Services Expected Discharge Plan: [place] Acute Care Choice: [place] Living arrangements for the past 2 months: [place]                                      Prior Living Arrangements/Services Living arrangements for the past 2 months: [place] Lives with:: Facility Resident          Need for Family Participation in Patient Care: Yes (Comment) Care giver support system in place?: Yes (comment)   Criminal Activity/Legal Involvement Pertinent to Current Situation/Hospitalization: No - Comment as needed  Activities of Daily Living Home Assistive Devices/Equipment: Wheelchair ADL Screening (condition at time of admission) Patient's cognitive ability adequate to safely complete daily activities?: Yes Is the patient deaf or have difficulty hearing?: No Does the patient have difficulty seeing, even when wearing glasses/contacts?: No Does the patient have difficulty concentrating, remembering, or  making decisions?: No Patient able to express need for assistance with ADLs?: Yes Does the patient have difficulty dressing or bathing?: Yes Independently performs ADLs?: No Communication: Independent Dressing (OT): Needs assistance Is this a change from baseline?: Pre-admission baseline Grooming: Needs assistance Is this a change from baseline?: Pre-admission baseline Feeding: Independent Bathing: Needs assistance Is this a change from baseline?: Pre-admission baseline Toileting: Dependent Is this a change from baseline?: Pre-admission baseline In/Out Bed: Dependent Is this a change from baseline?: Pre-admission baseline Walks in Home: Dependent Is this a change from baseline?: Pre-admission baseline Does the patient have difficulty walking or climbing stairs?: Yes Weakness of Legs: Both Weakness of Arms/Hands: None  Permission Sought/Granted   Permission granted to share information with : Yes, Verbal Permission Granted (From daughter)  Share Information with NAME: [place]           Emotional Assessment              Admission diagnosis:  Abdominal distention [R14.0] Ileus ([place]) [K56.7] Patient Active Problem List   Diagnosis Date Noted   Ileus ([place]) 09/23/2021   Delirium 04/27/2020   AKI (acute kidney injury) ([place]) 04/24/2020   CKD (chronic kidney disease), stage III ([place]) 04/24/2020   Protein-calorie malnutrition, severe 04/19/2020   Hypercalcemia 04/17/2020   Essential hypertension 04/17/2020   CKD (chronic kidney disease), stage IV ([place]) 04/17/2020   At risk for dehydration due to poor fluid intake 04/17/2020   Protein malnutrition ([place]) 04/17/2020   Debility 04/17/2020   Carotid artery  disease ([place]) 09/30/2018   Pure hypercholesterolemia 12/03/2013   OP (osteoporosis) 11/16/2013   PCP:  Sonya Ruths, MD Pharmacy:   [facility], [place] [address] Phone: [phone] Fax:  [phone]     Social Determinants of Health (SDOH) Interventions    Readmission Risk Interventions     No data to display

## 2021-09-24 NOTE — Progress Notes (Signed)
Patient with a dilated colon with a CT scan showing over 13 cm diameter. Will need urgent decompression. Aware K+ is low but procedure needs to be done.

## 2021-09-24 NOTE — Plan of Care (Signed)

## 2021-09-25 ENCOUNTER — Inpatient Hospital Stay: Payer: Medicare Other

## 2021-09-25 DIAGNOSIS — K5981 Ogilvie syndrome: Secondary | ICD-10-CM | POA: Diagnosis not present

## 2021-09-25 DIAGNOSIS — K5939 Other megacolon: Secondary | ICD-10-CM | POA: Diagnosis not present

## 2021-09-25 DIAGNOSIS — I10 Essential (primary) hypertension: Secondary | ICD-10-CM | POA: Diagnosis not present

## 2021-09-25 DIAGNOSIS — N184 Chronic kidney disease, stage 4 (severe): Secondary | ICD-10-CM | POA: Diagnosis not present

## 2021-09-25 LAB — CBC
HCT: 31.2 % — ABNORMAL LOW (ref 36.0–46.0)
Hemoglobin: 10.4 g/dL — ABNORMAL LOW (ref 12.0–15.0)
MCH: 31.4 pg (ref 26.0–34.0)
MCHC: 33.3 g/dL (ref 30.0–36.0)
MCV: 94.3 fL (ref 80.0–100.0)
Platelets: 167 10*3/uL (ref 150–400)
RBC: 3.31 MIL/uL — ABNORMAL LOW (ref 3.87–5.11)
RDW: 14.6 % (ref 11.5–15.5)
WBC: 6.5 10*3/uL (ref 4.0–10.5)
nRBC: 0 % (ref 0.0–0.2)

## 2021-09-25 LAB — BASIC METABOLIC PANEL
Anion gap: 4 — ABNORMAL LOW (ref 5–15)
BUN: 33 mg/dL — ABNORMAL HIGH (ref 8–23)
CO2: 15 mmol/L — ABNORMAL LOW (ref 22–32)
Calcium: 8.1 mg/dL — ABNORMAL LOW (ref 8.9–10.3)
Chloride: 123 mmol/L — ABNORMAL HIGH (ref 98–111)
Creatinine, Ser: 1.51 mg/dL — ABNORMAL HIGH (ref 0.44–1.00)
GFR, Estimated: 32 mL/min — ABNORMAL LOW (ref >60)
Glucose, Bld: 106 mg/dL — ABNORMAL HIGH (ref 70–99)
Potassium: 2.4 mmol/L — CL (ref 3.5–5.1)
Sodium: 142 mmol/L (ref 135–145)

## 2021-09-25 LAB — PHOSPHORUS: Phosphorus: 1.6 mg/dL — ABNORMAL LOW (ref 2.5–4.6)

## 2021-09-25 LAB — POTASSIUM: Potassium: 3.1 mmol/L — ABNORMAL LOW (ref 3.5–5.1)

## 2021-09-25 LAB — MAGNESIUM: Magnesium: 1.9 mg/dL (ref 1.7–2.4)

## 2021-09-25 MED ORDER — POTASSIUM CL IN DEXTROSE 5% 20 MEQ/L IV SOLN
20.0000 meq | INTRAVENOUS | Status: DC
  Administered 2021-09-25 – 2021-09-27 (×4): 20 meq via INTRAVENOUS
  Filled 2021-09-25 (×5): qty 1000

## 2021-09-25 MED ORDER — POTASSIUM CHLORIDE 10 MEQ/100ML IV SOLN
10.0000 meq | INTRAVENOUS | Status: AC
  Administered 2021-09-25 (×2): 10 meq via INTRAVENOUS
  Filled 2021-09-25 (×2): qty 100

## 2021-09-25 MED ORDER — POLYETHYLENE GLYCOL 3350 17 G PO PACK
17.0000 g | PACK | Freq: Every day | ORAL | Status: DC
  Administered 2021-09-25 – 2021-09-27 (×3): 17 g via ORAL
  Filled 2021-09-25 (×3): qty 1

## 2021-09-25 MED ORDER — MAGNESIUM SULFATE 2 GM/50ML IV SOLN
2.0000 g | Freq: Once | INTRAVENOUS | Status: AC
  Administered 2021-09-25: 2 g via INTRAVENOUS
  Filled 2021-09-25: qty 50

## 2021-09-25 MED ORDER — DEXTROSE 5 % IV SOLN
20.0000 mmol | Freq: Once | INTRAVENOUS | Status: AC
  Administered 2021-09-25: 20 mmol via INTRAVENOUS
  Filled 2021-09-25: qty 6.67

## 2021-09-25 NOTE — Progress Notes (Signed)
  Progress Note   Patient: Sonya Craig ZYS:[id number] DOB: 11-03-1927 DOA: 09/23/2021     2 DOS: the patient was seen and examined on 09/25/2021   Brief hospital course: 88 y.o. 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  Status post colonoscopy and colonic decompression.  Rectal decompression tube placed 8/6: Hypokalemia/hypomagnesemia   Assessment and Plan: * Ogilvie syndrome Status post colonic decompression and colonoscopy.  Rectal decompression tube placed by GI. Appreciate GI and surgical assistance.  Patient tolerating clear liquid diet so advance to full liquid today.  Advance as tolerated  Hypokalemia Replete and recheck, magnesium also low so we will replace  Protein-calorie malnutrition, severe Encourage oral nutrition.  CKD (chronic kidney disease), stage IV ([place]) Close to baseline.  Monitor kidney function.  Holding lisinopril  Lab Results  Component Value Date   CREATININE 1.51 (H) 09/25/2021   CREATININE 1.83 (H) 09/24/2021   CREATININE 1.79 (H) 09/24/2021    Essential hypertension Hold lisinopril due to poor kidney function        Subjective: Feeling much better.  Wants to eat.  Daughter at bedside  Physical Exam: Vitals:   09/24/21 1505 09/24/21 1950 09/25/21 0631 09/25/21 0752  BP: (!) 154/56 (!) 150/60 (!) 129/55 (!) 140/59  Pulse: 67 68 64 74  Resp: 18 17 18 20   Temp: 97.9 F (36.6 C) 98 F (36.7 C) 98 F (36.7 C) 98.3 F (36.8 C)  TempSrc: Oral Oral  Oral  SpO2: 99% 99% 99% 100%  Weight:      Height:       88 year old female lying in the bed very uncomfortable due to colonic distention Lungs decreased breath sounds at the bases Cardiovascular regular rate and rhythm Abdomen soft, benign Skin no rash or lesion Neuro alert and oriented, nonfocal Psych normal mood and affect  Data Reviewed:  Potassium 2.4, magnesium  1.6, creatinine 1.5  Family Communication: Daughter updated at bedside   Disposition: Status is: Inpatient Remains inpatient appropriate because: Electrolyte replacement and waiting for advancement of diet   Planned Discharge Destination: Home    DVT prophylaxis-SCDs Time spent: 35 minutes  Author: Max Sane, MD 09/25/2021 12:11 PM  For on call review [email or web address].

## 2021-09-25 NOTE — Assessment & Plan Note (Signed)
Hold lisinopril due to poor kidney function

## 2021-09-25 NOTE — Progress Notes (Signed)
PT Cancellation Note  Patient Details Name: Sonya Craig MRN: [id number] DOB: 1928/02/17   Cancelled Treatment:    Reason Eval/Treat Not Completed: Patient not medically ready PT orders received, chart reviewed. Pt noted to have critically low K+ (2.4) & exertional activity contraindicated at this time. Will f/u as able & as pt is medically appropriate for PT evaluation.  Lavone Nian, PT, DPT 09/25/21, 1:35 PM  Waunita Schooner 09/25/2021, 1:35 PM

## 2021-09-25 NOTE — Consult Note (Addendum)
[place] for Electrolyte Monitoring and Replacement   Recent Labs: Potassium (mmol/L)  Date Value  09/25/2021 2.4 (LL)   Magnesium (mg/dL)  Date Value  09/25/2021 1.9   Calcium (mg/dL)  Date Value  09/25/2021 8.1 (L)   Calcium, Total (PTH) (mg/dL)  Date Value  04/18/2020 13.7   Albumin (g/dL)  Date Value  09/23/2021 3.7   Phosphorus (mg/dL)  Date Value  09/25/2021 1.6 (L)   Sodium (mmol/L)  Date Value  09/25/2021 142   Assessment: Patient is a 88 y/o F with medical history including DM, HTN, HLD, CKD who is admitted with large bowel pseudoobstruction. GI performed urgent decompression procedure on 8/5. Pharmacy consulted to assist with electrolyte monitoring and replacement as indicated.  Na 147 >> 142 Scr 1.97 >> 1.51 Co2 15 >> 15  MIVF: D5w at 75 cc/hr >> D5w + 20 mEq K/L at 75 cc/hr  Daughter requesting potassium supplementation be given IV When able to take PO replacement, would preference potassium acetate solution given hyperchloremic metabolic acidosis   Goal of Therapy:  Electrolytes within normal limits  Plan:  --Hypernatremia resolved, continue with fluids. Will consider changing base fluid to D5LR if sodium / chloride continue to downtrend --K 2.4, change MIVF to D5 + 20 mEq K / L at 75 cc/hr (36 mEq K per day) and patient will receive ~ 30 mEq K+ from potassium phosphate supplementation. Will give additional Kcl 10 mEq IV x 2 doses. Patient further received PO Kcl 20 mEq x 1 dose this AM but per daughter's request hold off on further oral potassium replacement --Phos 1.6, will give potassium phosphate 20 mmol x 1 dose today --Mg 1.9, magnesium sulfate 2 g IV x 1 --Re-check potassium at 2000 this evening --Follow-up all electrolytes with AM labs tomorrow  Benita Gutter 09/25/2021 9:52 AM

## 2021-09-25 NOTE — Assessment & Plan Note (Signed)
Close to baseline.  Monitor kidney function.  Holding lisinopril  Lab Results  Component Value Date   CREATININE 1.51 (H) 09/25/2021   CREATININE 1.83 (H) 09/24/2021   CREATININE 1.79 (H) 09/24/2021

## 2021-09-25 NOTE — Assessment & Plan Note (Signed)
Encourage oral nutrition.

## 2021-09-25 NOTE — Assessment & Plan Note (Signed)
Status post colonic decompression and colonoscopy.  Rectal decompression tube placed by GI. Appreciate GI and surgical assistance.  Patient tolerating clear liquid diet so advance to full liquid today.  Advance as tolerated

## 2021-09-25 NOTE — Progress Notes (Signed)
CC: Ogilvie's Subjective: Feeling better.  Tolerating clears.  Had decompression rectal tube. Abdomen less distended  Objective: Vital signs in last 24 hours: Temp:  [97.9 F (36.6 C)-98.3 F (36.8 C)] 98.3 F (36.8 C) (08/06 0752) Pulse Rate:  [64-74] 74 (08/06 0752) Resp:  [17-20] 20 (08/06 0752) BP: (129-154)/(55-60) 140/59 (08/06 0752) SpO2:  [99 %-100 %] 100 % (08/06 0752) Last BM Date : 09/24/21  Intake/Output from previous day: 08/05 0701 - 08/06 0700 In: 740.2 [I.V.:740.2] Out: 600 [Urine:600] Intake/Output this shift: Total I/O In: -  Out: 700 [Urine:700]  Physical exam:Chaperone present  Debilitated female NAD Abd: soft, mildly distended, non tender significant improvement. No peritonitis Rectal: tube in place, no masses  Lab Results: CBC  Recent Labs    09/23/21 1421 09/25/21 0825  WBC 4.2 6.5  HGB 11.5* 10.4*  HCT 36.4 31.2*  PLT 185 167   BMET Recent Labs    09/24/21 1308 09/25/21 0825  NA 146* 142  K 3.8 2.4*  CL 127* 123*  CO2 12* 15*  GLUCOSE 123* 106*  BUN 43* 33*  CREATININE 1.83* 1.51*  CALCIUM 8.7* 8.1*   PT/INR No results for input(s): "LABPROT", "INR" in the last 72 hours. ABG No results for input(s): "PHART", "HCO3" in the last 72 hours.  Invalid input(s): "PCO2", "PO2"  Studies/Results: DG ABD ACUTE 2+V W 1V CHEST  Result Date: 09/25/2021 CLINICAL DATA:  Bowel decompression. EXAM: DG ABDOMEN ACUTE WITH 1 VIEW CHEST COMPARISON:  09/24/2021 FINDINGS: Frontal chest shows low volumes. Interstitial markings are diffusely coarsened with chronic features. Cardiopericardial silhouette is at upper limits of normal for size. Bones are diffusely demineralized. Upright film shows no evidence for intraperitoneal free air. Supine film shows persistent but clearly decreased gaseous distention of small bowel and colon. IMPRESSION: 1. No acute cardiopulmonary findings. 2. No evidence for intraperitoneal free air. 3. Persistent but improved  gaseous distention of small bowel and colon. Electronically Signed   By: Misty Stanley M.D.   On: 09/25/2021 09:27   DG Abd 2 Views  Result Date: 09/24/2021 CLINICAL DATA:  Abdominal distension. EXAM: ABDOMEN - 2 VIEW COMPARISON:  09/23/2021 FINDINGS: Marked gaseous distention of small bowel and colon again noted without substantial change although there may be minimally decreased distension of small bowel. Bones are diffusely demineralized. IMPRESSION: Persistent marked gaseous distention of small bowel and colon without substantial change although minimal decrease in small bowel distension. Electronically Signed   By: Misty Stanley M.D.   On: 09/24/2021 09:22   CT ABDOMEN PELVIS WO CONTRAST  Result Date: 09/23/2021 CLINICAL DATA:  Abdominal distension, pain, loose bowel movements EXAM: CT ABDOMEN AND PELVIS WITHOUT CONTRAST TECHNIQUE: Multidetector CT imaging of the abdomen and pelvis was performed following the standard protocol without IV contrast. Unenhanced CT was performed per clinician order. Lack of IV contrast limits sensitivity and specificity, especially for evaluation of abdominal/pelvic solid viscera. RADIATION DOSE REDUCTION: This exam was performed according to the departmental dose-optimization program which includes automated exposure control, adjustment of the mA and/or kV according to patient size and/or use of iterative reconstruction technique. COMPARISON:  09/23/2021. FINDINGS: Lower chest: No acute pleural or parenchymal lung disease. Hepatobiliary: Unremarkable unenhanced appearance of the liver and gallbladder. Pancreas: Unremarkable unenhanced appearance. Spleen: Unremarkable unenhanced appearance. Adrenals/Urinary Tract: No urinary tract calculi or obstructive uropathy within either kidney. The adrenals are grossly unremarkable. Bladder is moderately distended, with a large diverticulum off the left superolateral aspect. No filling defect. Stomach/Bowel: There is marked gaseous  distension of the small bowel and colon through the level of the rectum. This is most pronounced within the sigmoid colon, which measures up to 13.3 cm in maximal diameter. Moderate retained stool and fluid within the rectal vault. I do not see any obstructing lesion. No evidence of bowel wall thickening. Vascular/Lymphatic: Diffuse atherosclerosis of the aorta and its branches. No pathologic adenopathy within the abdomen or pelvis. Reproductive: Uterus is atrophic with multiple calcifications consistent with small calcified fibroids. Minimally complex right adnexal cyst identified, measuring 6.5 x 6.1 x 4.4 cm, with a single thin septation identified. The left ovary is not well visualized. Other: No free fluid or free intraperitoneal gas. Right inguinal hernia contains a small amount of fluid. No bowel herniation. There is extensive subcutaneous edema within the lower abdominal wall and proximal lower extremities. Musculoskeletal: No acute or destructive bony lesions. Reconstructed images demonstrate no additional findings. IMPRESSION: 1. Diffuse gaseous distension of the large and small bowel, to the level of the rectum. No evidence of obstructing lesion. Moderate retained stool in fluid within the rectal vault consistent with history of diarrhea. 2. Complex right adnexal cyst. If the patient would be a therapy candidate should neoplasm be detected, follow-up nonemergent outpatient pelvic ultrasound could be considered. 3. Right inguinal hernia, with a small amount of fluid in the hernia sac. No bowel herniation. 4. Diffuse subcutaneous edema within the lower abdominal wall and visualized lower extremities. 5.  Aortic Atherosclerosis (ICD10-I70.0). Electronically Signed   By: Randa Ngo M.D.   On: 09/23/2021 16:52   DG Abdomen 1 View  Result Date: 09/23/2021 CLINICAL DATA:  Abdominal distension. EXAM: ABDOMEN - 1 VIEW COMPARISON:  None Available. FINDINGS: Supine view of the abdomen and pelvis shows diffuse  markedly gas distended small bowel and colon. Prominent arterial vascular calcification evident. Numerous phleboliths are noted along the left pelvic sidewalls. Bones are diffusely demineralized. IMPRESSION: Diffuse marked gaseous distention of small bowel and colon. Imaging features are compatible with severe ileus although distal colonic obstruction cannot be excluded. Electronically Signed   By: Misty Stanley M.D.   On: 09/23/2021 14:21    Anti-infectives: Anti-infectives (From admission, onward)    None       Assessment/Plan: Ogilvie's syndrome slowly resolving.  Recommend aggressive correction of potassium.  There is no need for surgical intervention .  family and patient not interested in any surgical intervention.  I will be on standby. Please note that I spent greater than 35 minutes in this encounter including personally reviewing imaging studies, coordinating her care, placing orders and performing appropriate mentation.   Caroleen Hamman, MD, [facility]  09/25/2021

## 2021-09-25 NOTE — Assessment & Plan Note (Signed)
Replete and recheck, magnesium also low so we will replace

## 2021-09-26 ENCOUNTER — Inpatient Hospital Stay: Payer: Medicare Other

## 2021-09-26 ENCOUNTER — Encounter: Payer: Self-pay | Admitting: Gastroenterology

## 2021-09-26 DIAGNOSIS — E876 Hypokalemia: Secondary | ICD-10-CM

## 2021-09-26 DIAGNOSIS — K5981 Ogilvie syndrome: Secondary | ICD-10-CM | POA: Diagnosis not present

## 2021-09-26 DIAGNOSIS — E43 Unspecified severe protein-calorie malnutrition: Secondary | ICD-10-CM | POA: Diagnosis not present

## 2021-09-26 DIAGNOSIS — N184 Chronic kidney disease, stage 4 (severe): Secondary | ICD-10-CM | POA: Diagnosis not present

## 2021-09-26 LAB — BASIC METABOLIC PANEL
Anion gap: 4 — ABNORMAL LOW (ref 5–15)
BUN: 26 mg/dL — ABNORMAL HIGH (ref 8–23)
CO2: 14 mmol/L — ABNORMAL LOW (ref 22–32)
Calcium: 7.7 mg/dL — ABNORMAL LOW (ref 8.9–10.3)
Chloride: 116 mmol/L — ABNORMAL HIGH (ref 98–111)
Creatinine, Ser: 1.45 mg/dL — ABNORMAL HIGH (ref 0.44–1.00)
GFR, Estimated: 34 mL/min — ABNORMAL LOW (ref >60)
Glucose, Bld: 148 mg/dL — ABNORMAL HIGH (ref 70–99)
Potassium: 3.2 mmol/L — ABNORMAL LOW (ref 3.5–5.1)
Sodium: 134 mmol/L — ABNORMAL LOW (ref 135–145)

## 2021-09-26 LAB — CBC
HCT: 32.6 % — ABNORMAL LOW (ref 36.0–46.0)
Hemoglobin: 10.9 g/dL — ABNORMAL LOW (ref 12.0–15.0)
MCH: 31.1 pg (ref 26.0–34.0)
MCHC: 33.4 g/dL (ref 30.0–36.0)
MCV: 92.9 fL (ref 80.0–100.0)
Platelets: 176 10*3/uL (ref 150–400)
RBC: 3.51 MIL/uL — ABNORMAL LOW (ref 3.87–5.11)
RDW: 14.5 % (ref 11.5–15.5)
WBC: 7.8 10*3/uL (ref 4.0–10.5)
nRBC: 0 % (ref 0.0–0.2)

## 2021-09-26 MED ORDER — ZINC OXIDE 40 % EX OINT
TOPICAL_OINTMENT | Freq: Two times a day (BID) | CUTANEOUS | Status: DC | PRN
  Filled 2021-09-26: qty 113

## 2021-09-26 MED ORDER — POTASSIUM CHLORIDE CRYS ER 20 MEQ PO TBCR
40.0000 meq | EXTENDED_RELEASE_TABLET | Freq: Once | ORAL | Status: AC
  Administered 2021-09-26: 40 meq via ORAL
  Filled 2021-09-26: qty 2

## 2021-09-26 NOTE — Assessment & Plan Note (Signed)
Hold lisinopril due to poor kidney function.

## 2021-09-26 NOTE — Assessment & Plan Note (Signed)
Close to baseline.  Monitor kidney function.  Holding lisinopril.  Lab Results  Component Value Date   CREATININE 1.45 (H) 09/26/2021   CREATININE 1.51 (H) 09/25/2021   CREATININE 1.83 (H) 09/24/2021

## 2021-09-26 NOTE — TOC Progression Note (Signed)
Transition of Care [facility]) - Progression Note    Patient Details  Name: Sonya Craig MRN: [id number] Date of Birth: 10-12-1927  Transition of Care [facility]) CM/SW [place], LCSW Phone Number: 09/26/2021, 1:15 PM  Clinical Narrative:   [facility] admissions coordinator is aware of plan for patient to return tomorrow. Signed DNR is in her chart.  Expected Discharge Plan: [place] Barriers to Discharge: Continued Medical Work up  Expected Discharge Plan and Services Expected Discharge Plan: [place] Choice: [place] arrangements for the past 2 months: [place]                                       Social Determinants of Health (SDOH) Interventions    Readmission Risk Interventions     No data to display

## 2021-09-26 NOTE — Assessment & Plan Note (Addendum)
Status post colonic decompression and colonoscopy.  Rectal decompression tube placed by GI.  Tube removed today Appreciate GI and surgical assistance.  Patient tolerating diet

## 2021-09-26 NOTE — Care Management Important Message (Signed)
Important Message  Patient Details  Name: Sonya Craig MRN: [id number] Date of Birth: 30-Oct-1927   Medicare Important Message Given:  Yes     Dannette Barbara 09/26/2021, 11:16 AM

## 2021-09-26 NOTE — Assessment & Plan Note (Signed)
Replete and recheck ?

## 2021-09-26 NOTE — Evaluation (Signed)
Physical Therapy Evaluation Patient Details Name: Sonya Craig MRN: [id number] DOB: Oct 13, 1927 Today's Date: 09/26/2021  History of Present Illness  Pt is a 88 y/o F admitted on 09/23/21 for tx of large bowel pseudoobstruction/ogilvie syndrome. Pt is s/p colonoscopy & colonic decompression on 8/5 & had a rectal decompression tube placed. PMH: DM2, HTN, HLD, CKD stage IV  Clinical Impression  Pt seen for PT evaluation with pt's daughters present for session. Pt is a resident at [facility] long term care where she has been bed bound for ~year unless daughter provides total assist to get her to chair. Pt is agreeable to bed level activity & requires max assist +2 for supine<>sit. Pt demonstrates poor sitting balance & inability tolerate R knee flexion EOB & requests to be returned supine. Pt's family is eager to gain more functional mobility but pt reports she will do it on her terms. Pt is currently at baseline level of mobility & does not require acute PT services. PT to sign off at this time & pt & family made aware.       Recommendations for follow up therapy are one component of a multi-disciplinary discharge planning process, led by the attending physician.  Recommendations may be updated based on patient status, additional functional criteria and insurance authorization.  Follow Up Recommendations No PT follow up      Assistance Recommended at Discharge Frequent or constant Supervision/Assistance  Patient can return home with the following  Two people to help with walking and/or transfers;Two people to help with bathing/dressing/bathroom    Equipment Recommendations None recommended by PT  Recommendations for Other Services       Functional Status Assessment Patient has not had a recent decline in their functional status     Precautions / Restrictions Precautions Precautions: Fall Restrictions Weight Bearing Restrictions: No      Mobility  Bed Mobility Overal bed mobility:  Needs Assistance Bed Mobility: Supine to Sit, Sit to Supine     Supine to sit: Max assist, +2 for physical assistance, HOB elevated (pt requires assistance & cuing to initiate moving BLE towards EOB, cuing for use of bed rails, but ultimately +2 for supine<>sit) Sit to supine: Max assist, +2 for physical assistance, HOB elevated        Transfers                        Ambulation/Gait                  Stairs            Wheelchair Mobility    Modified Rankin (Stroke Patients Only)       Balance Overall balance assessment: Needs assistance Sitting-balance support: Feet unsupported, Bilateral upper extremity supported Sitting balance-Leahy Scale: Zero Sitting balance - Comments: feet unsupported 2/2 pt unable to flex BLE knees to place them on floor even with bed in lowest position, posterior lean & posterior pelvic tilt requiring assistance for upright posture, pt with L lateral lean in sitting but could be 2/2 fatigue & decreased ability to hold trunk upright                                     Pertinent Vitals/Pain Pain Assessment Pain Assessment: Faces Faces Pain Scale: Hurts little more Pain Location: R knee with flexion Pain Descriptors / Indicators: Discomfort Pain Intervention(s): Repositioned  Home Living Family/patient expects to be discharged to:: Other (Comment) ([place] at [facility])                        Prior Function               Mobility Comments: Daughter reports pt was ambulatory until ~year ago when she became bedbound. Daughter has helped her OOB to chair a few times in the past year but reports "it was a job".       Hand Dominance        Extremity/Trunk Assessment   Upper Extremity Assessment Upper Extremity Assessment: Generalized weakness (not formally tested)    Lower Extremity Assessment Lower Extremity Assessment: Generalized weakness (pt doesn't tolerate R knee flexion  while sitting EOB, not able to flex either BLE to 90 degrees to sit EOB)    Cervical / Trunk Assessment Cervical / Trunk Assessment: Kyphotic (rounded shoulders, forward head, posterior pelvic tilt in sitting)  Communication   Communication: No difficulties  Cognition Arousal/Alertness: Awake/alert Behavior During Therapy: WFL for tasks assessed/performed Overall Cognitive Status: Within Functional Limits for tasks assessed                                 General Comments: AxOx3        General Comments General comments (skin integrity, edema, etc.): Pt noted to have snuff in mouth & family aware; nurse & MD notified    Exercises     Assessment/Plan    PT Assessment Patient does not need any further PT services  PT Problem List         PT Treatment Interventions      PT Goals (Current goals can be found in the Care Plan section)  Acute Rehab PT Goals Patient Stated Goal: family would like for pt to do more in regards to functional mobility PT Goal Formulation: With family Time For Goal Achievement: 10/10/21 Potential to Achieve Goals: Poor    Frequency       Co-evaluation               AM-PAC PT "6 Clicks" Mobility  Outcome Measure Help needed turning from your back to your side while in a flat bed without using bedrails?: Total Help needed moving from lying on your back to sitting on the side of a flat bed without using bedrails?: Total Help needed moving to and from a bed to a chair (including a wheelchair)?: Total Help needed standing up from a chair using your arms (e.g., wheelchair or bedside chair)?: Total Help needed to walk in hospital room?: Total Help needed climbing 3-5 steps with a railing? : Total 6 Click Score: 6    End of Session   Activity Tolerance: Patient limited by pain Patient left: in bed;with call bell/phone within reach;with bed alarm set;with SCD's reapplied;with family/visitor present        Time: 2641-5830 PT  Time Calculation (min) (ACUTE ONLY): 13 min   Charges:   PT Evaluation $PT Eval Low Complexity: 1 Low          Lavone Nian, PT, DPT 09/26/21, 10:58 AM   Waunita Schooner 09/26/2021, 10:57 AM

## 2021-09-26 NOTE — Assessment & Plan Note (Signed)
Encourage oral nutrition

## 2021-09-26 NOTE — Progress Notes (Signed)
[name], MD [facility]   [address] [place], [place] [address] Phone: [phone] Fax : [phone]   Subjective: The patient appears in no distress today.  Despite her KUB her abdomen remains significantly softer today than it was prior to her decompression.  The patient denies any issues today.   Objective: Vital signs in last 24 hours: Vitals:   09/25/21 1930 09/26/21 0504 09/26/21 0757 09/26/21 1603  BP: (!) 150/74 133/64 134/62 (!) 121/57  Pulse: 88 88 81 86  Resp: 16 16 18 18   Temp: 99.5 F (37.5 C) 99.8 F (37.7 C) 99 F (37.2 C) 99 F (37.2 C)  TempSrc: Oral Oral    SpO2: 100% 100% 100% 100%  Weight:      Height:       Weight change:   Intake/Output Summary (Last 24 hours) at 09/26/2021 1906 Last data filed at 09/26/2021 1859 Gross per 24 hour  Intake 1182.14 ml  Output 600 ml  Net 582.14 ml     Exam: Heart:: Regular rate and rhythm, S1S2 present, or without murmur or extra heart sounds Lungs: normal and clear to auscultation and percussion Abdomen: soft, nontender, normal bowel sounds   Lab Results: @LABTEST2 @ Micro Results: No results found for this or any previous visit (from the past 240 hour(s)). Studies/Results: DG Abd 1 View  Result Date: 09/26/2021 CLINICAL DATA:  Abdominal distension. EXAM: ABDOMEN - 1 VIEW COMPARISON:  KUB 09/25/2021 and 09/25/2018.  CT AP, 09/23/2021. FINDINGS: Mild air distention of small bowel, ascending, transverse and descending colon with marked air distention of sigmoid. No overt pneumoperitoneum. Calcified radiodensities within the pelvis, best appreciated on comparison CT. No interval osseous abnormality IMPRESSION: Persistent marked air distention of sigmoid colon, with calcified fecaliths at rectum. Findings suspicious for large bowel obstruction. Correlate for possible impaction. Electronically Signed   By: Michaelle Birks M.D.   On: 09/26/2021 07:52   DG ABD ACUTE 2+V W 1V CHEST  Result Date:  09/25/2021 CLINICAL DATA:  Bowel decompression. EXAM: DG ABDOMEN ACUTE WITH 1 VIEW CHEST COMPARISON:  09/24/2021 FINDINGS: Frontal chest shows low volumes. Interstitial markings are diffusely coarsened with chronic features. Cardiopericardial silhouette is at upper limits of normal for size. Bones are diffusely demineralized. Upright film shows no evidence for intraperitoneal free air. Supine film shows persistent but clearly decreased gaseous distention of small bowel and colon. IMPRESSION: 1. No acute cardiopulmonary findings. 2. No evidence for intraperitoneal free air. 3. Persistent but improved gaseous distention of small bowel and colon. Electronically Signed   By: Misty Stanley M.D.   On: 09/25/2021 09:27   Medications: I have reviewed the patient's current medications. Scheduled Meds:  polyethylene glycol  17 g Oral Daily   Continuous Infusions:  dextrose 5 % with KCl 20 mEq / L 75 mL/hr at 09/26/21 0546   PRN Meds:.acetaminophen **OR** acetaminophen, liver oil-zinc oxide   Assessment: Principal Problem:   Ogilvie syndrome Active Problems:   Essential hypertension   CKD (chronic kidney disease), stage IV (HCC)   Protein-calorie malnutrition, severe   Hypokalemia    Plan: Who had a colonic decompression for Ogilvie syndrome.  The patient's abdomen continues to be soft.  The patient's electrolytes are still not back to normal which may be contributing to her issues.  Nothing planned at this time further from a GI point of view.   LOS: 3 days   Lewayne Bunting 09/26/2021, 7:06 PM Pager [phone] 7am-5pm  Check AMION for 5pm -7am coverage and on  weekends

## 2021-09-26 NOTE — Progress Notes (Signed)
  Progress Note   Patient: Sonya Craig UJW:[id number] DOB: 10-Dec-1927 DOA: 09/23/2021     3 DOS: the patient was seen and examined on 09/26/2021   Brief hospital course: 88 y.o. 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  Status post colonoscopy and colonic decompression.  Rectal decompression tube placed 8/6: Hypokalemia/hypomagnesemia 8/7: Rectal tube removed, replacement of electrolytes   Assessment and Plan: * Ogilvie syndrome Status post colonic decompression and colonoscopy.  Rectal decompression tube placed by GI.  Tube removed today Appreciate GI and surgical assistance.  Patient tolerating diet  Hypokalemia Replete and recheck  Protein-calorie malnutrition, severe Encourage oral nutrition  CKD (chronic kidney disease), stage IV ([place]) Close to baseline.  Monitor kidney function.  Holding lisinopril.  Lab Results  Component Value Date   CREATININE 1.45 (H) 09/26/2021   CREATININE 1.51 (H) 09/25/2021   CREATININE 1.83 (H) 09/24/2021    Essential hypertension Hold lisinopril due to poor kidney function.        Subjective: Denies any new issues.  Happy to have rectal tube removed.  Daughter at bedside  Physical Exam: Vitals:   09/25/21 1653 09/25/21 1930 09/26/21 0504 09/26/21 0757  BP: (!) 159/66 (!) 150/74 133/64 134/62  Pulse: 81 88 88 81  Resp: 20 16 16 18   Temp: 98.9 F (37.2 C) 99.5 F (37.5 C) 99.8 F (37.7 C) 99 F (37.2 C)  TempSrc: Oral Oral Oral   SpO2: 100% 100% 100% 100%  Weight:      Height:       88 year old female lying in the bed in no acute distress Lungs decreased breath sounds at the bases Cardiovascular regular rate and rhythm Abdomen soft, benign Skin no rash or lesion Neuro alert and oriented, nonfocal Psych normal mood and affect Data Reviewed:  Potassium 3.2, creatinine 1.45.  KUB shows dilated large  bowel although still present in sigmoid colon  Family Communication: Daughter updated at bedside  Disposition: Status is: Inpatient Remains inpatient appropriate because: Getting rectal tube out and make sure she tolerates diet and ambulates   Planned Discharge Destination: [place]    DVT prophylaxis-SCDs Time spent: 35 minutes  Author: Max Sane, MD 09/26/2021 1:52 PM  For on call review [email or web address].

## 2021-09-27 DIAGNOSIS — K5981 Ogilvie syndrome: Secondary | ICD-10-CM | POA: Diagnosis not present

## 2021-09-27 DIAGNOSIS — K567 Ileus, unspecified: Secondary | ICD-10-CM

## 2021-09-27 DIAGNOSIS — N184 Chronic kidney disease, stage 4 (severe): Secondary | ICD-10-CM | POA: Diagnosis not present

## 2021-09-27 DIAGNOSIS — K5939 Other megacolon: Secondary | ICD-10-CM | POA: Diagnosis not present

## 2021-09-27 DIAGNOSIS — I10 Essential (primary) hypertension: Secondary | ICD-10-CM | POA: Diagnosis not present

## 2021-09-27 LAB — CBC
HCT: 29.9 % — ABNORMAL LOW (ref 36.0–46.0)
Hemoglobin: 10 g/dL — ABNORMAL LOW (ref 12.0–15.0)
MCH: 31.3 pg (ref 26.0–34.0)
MCHC: 33.4 g/dL (ref 30.0–36.0)
MCV: 93.7 fL (ref 80.0–100.0)
Platelets: 158 10*3/uL (ref 150–400)
RBC: 3.19 MIL/uL — ABNORMAL LOW (ref 3.87–5.11)
RDW: 14.6 % (ref 11.5–15.5)
WBC: 5.7 10*3/uL (ref 4.0–10.5)
nRBC: 0 % (ref 0.0–0.2)

## 2021-09-27 LAB — BASIC METABOLIC PANEL
Anion gap: 4 — ABNORMAL LOW (ref 5–15)
BUN: 23 mg/dL (ref 8–23)
CO2: 16 mmol/L — ABNORMAL LOW (ref 22–32)
Calcium: 7.8 mg/dL — ABNORMAL LOW (ref 8.9–10.3)
Chloride: 119 mmol/L — ABNORMAL HIGH (ref 98–111)
Creatinine, Ser: 1.49 mg/dL — ABNORMAL HIGH (ref 0.44–1.00)
GFR, Estimated: 33 mL/min — ABNORMAL LOW (ref >60)
Glucose, Bld: 68 mg/dL — ABNORMAL LOW (ref 70–99)
Potassium: 3.2 mmol/L — ABNORMAL LOW (ref 3.5–5.1)
Sodium: 139 mmol/L (ref 135–145)

## 2021-09-27 MED ORDER — POTASSIUM CHLORIDE CRYS ER 20 MEQ PO TBCR
40.0000 meq | EXTENDED_RELEASE_TABLET | Freq: Once | ORAL | Status: AC
  Administered 2021-09-27: 40 meq via ORAL
  Filled 2021-09-27: qty 2

## 2021-09-27 MED ORDER — K PHOS MONO-SOD PHOS DI & MONO 155-852-130 MG PO TABS
500.0000 mg | ORAL_TABLET | Freq: Two times a day (BID) | ORAL | Status: DC
  Administered 2021-09-27: 500 mg via ORAL
  Filled 2021-09-27: qty 2

## 2021-09-27 MED ORDER — POTASSIUM CHLORIDE CRYS ER 20 MEQ PO TBCR: 40.0000 meq | EXTENDED_RELEASE_TABLET | Freq: Once | ORAL | Status: DC

## 2021-09-27 NOTE — Discharge Summary (Signed)
Physician Discharge Summary   Patient: Sonya Craig MRN: [id number] DOB: 01-12-1928  Admit date:     09/23/2021  Discharge date: 09/27/21  Discharge Physician: Max Sane   PCP: Kirk Ruths, MD   Recommendations at discharge:    F/up with outpt providers as requested  Discharge Diagnoses: Principal Problem:   Ogilvie syndrome Active Problems:   Essential hypertension   CKD (chronic kidney disease), stage IV ([place])   Protein-calorie malnutrition, severe   Hypokalemia  Resolved Problems:   Dilatation of colon  Hospital Course: 88 y.o. 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  Status post colonoscopy and colonic decompression.  Rectal decompression tube placed 8/6: Hypokalemia/hypomagnesemia 8/7: Rectal tube removed, replacement of electrolytes  Assessment and Plan: * Ogilvie syndrome Status post colonic decompression and colonoscopy.  Rectal decompression tube placed for a day and removed on 8/7  Patient tolerating diet  Hypokalemia Repleted. Will need ongoing outpt monitoring and replacement. Recommend high K diet.  Protein-calorie malnutrition, severe Encouraged oral nutrition. Her motivation to eat is the biggest issue  CKD (chronic kidney disease), stage IV ([place]) At baseline  Essential hypertension       Consultants: GI, surgery Procedures performed: c-scope on 8/5  Disposition: Nursing home Diet recommendation:  Discharge Diet Orders (From admission, onward)     Start     Ordered   09/27/21 0000  Diet - low sodium heart healthy        09/27/21 1044           Regular diet DISCHARGE MEDICATION: Allergies as of 09/27/2021   No Known Allergies      Medication List     STOP taking these medications    aspirin EC 81 MG tablet   lovastatin 40 MG tablet Commonly known as: MEVACOR       TAKE these medications     acetaminophen 325 MG tablet Commonly known as: TYLENOL Take 2 tablets (650 mg total) by mouth every 6 (six) hours as needed for mild pain (or Fever >/= 101).   Diclofenac Sodium 1.5 % Soln APPLY 40 DROPS TOPICALLY 4 TIMES A DAY TO AFFECTED AREA   feeding supplement Liqd Take 237 mLs by mouth 2 (two) times daily between meals.   lisinopril 20 MG tablet Commonly known as: ZESTRIL Take 1 tablet (20 mg total) by mouth daily.   mirtazapine 7.5 MG tablet Commonly known as: REMERON Take 7.5 mg by mouth at bedtime.   ondansetron 4 MG disintegrating tablet Commonly known as: ZOFRAN-ODT Take 1 tablet (4 mg total) by mouth every 6 (six) hours as needed for nausea.   potassium chloride SA 20 MEQ tablet Commonly known as: KLOR-CON M Take 20 mEq by mouth 2 (two) times daily.        Contact information for after-discharge care     Destination     [facility] .   Service: Skilled Nursing Contact information: [place] [phone]                    Discharge Exam: Danley Danker Weights   09/23/21 1333  Weight: 30.27 kg   88 year old female lying in the bed in no acute distress Lungs decreased breath sounds at the bases Cardiovascular regular rate and rhythm Abdomen soft, benign Skin no rash or lesion Neuro alert and oriented, nonfocal Psych normal mood and  affect  Condition at discharge: fair  The results of significant diagnostics from this hospitalization (including imaging, microbiology, ancillary and laboratory) are listed below for reference.   Imaging Studies: DG Abd 1 View  Result Date: 09/26/2021 CLINICAL DATA:  Abdominal distension. EXAM: ABDOMEN - 1 VIEW COMPARISON:  KUB 09/25/2021 and 09/25/2018.  CT AP, 09/23/2021. FINDINGS: Mild air distention of small bowel, ascending, transverse and descending colon with marked air distention of sigmoid. No overt pneumoperitoneum. Calcified radiodensities  within the pelvis, best appreciated on comparison CT. No interval osseous abnormality IMPRESSION: Persistent marked air distention of sigmoid colon, with calcified fecaliths at rectum. Findings suspicious for large bowel obstruction. Correlate for possible impaction. Electronically Signed   By: Michaelle Birks M.D.   On: 09/26/2021 07:52   DG ABD ACUTE 2+V W 1V CHEST  Result Date: 09/25/2021 CLINICAL DATA:  Bowel decompression. EXAM: DG ABDOMEN ACUTE WITH 1 VIEW CHEST COMPARISON:  09/24/2021 FINDINGS: Frontal chest shows low volumes. Interstitial markings are diffusely coarsened with chronic features. Cardiopericardial silhouette is at upper limits of normal for size. Bones are diffusely demineralized. Upright film shows no evidence for intraperitoneal free air. Supine film shows persistent but clearly decreased gaseous distention of small bowel and colon. IMPRESSION: 1. No acute cardiopulmonary findings. 2. No evidence for intraperitoneal free air. 3. Persistent but improved gaseous distention of small bowel and colon. Electronically Signed   By: Misty Stanley M.D.   On: 09/25/2021 09:27   DG Abd 2 Views  Result Date: 09/24/2021 CLINICAL DATA:  Abdominal distension. EXAM: ABDOMEN - 2 VIEW COMPARISON:  09/23/2021 FINDINGS: Marked gaseous distention of small bowel and colon again noted without substantial change although there may be minimally decreased distension of small bowel. Bones are diffusely demineralized. IMPRESSION: Persistent marked gaseous distention of small bowel and colon without substantial change although minimal decrease in small bowel distension. Electronically Signed   By: Misty Stanley M.D.   On: 09/24/2021 09:22   CT ABDOMEN PELVIS WO CONTRAST  Result Date: 09/23/2021 CLINICAL DATA:  Abdominal distension, pain, loose bowel movements EXAM: CT ABDOMEN AND PELVIS WITHOUT CONTRAST TECHNIQUE: Multidetector CT imaging of the abdomen and pelvis was performed following the standard protocol  without IV contrast. Unenhanced CT was performed per clinician order. Lack of IV contrast limits sensitivity and specificity, especially for evaluation of abdominal/pelvic solid viscera. RADIATION DOSE REDUCTION: This exam was performed according to the departmental dose-optimization program which includes automated exposure control, adjustment of the mA and/or kV according to patient size and/or use of iterative reconstruction technique. COMPARISON:  09/23/2021. FINDINGS: Lower chest: No acute pleural or parenchymal lung disease. Hepatobiliary: Unremarkable unenhanced appearance of the liver and gallbladder. Pancreas: Unremarkable unenhanced appearance. Spleen: Unremarkable unenhanced appearance. Adrenals/Urinary Tract: No urinary tract calculi or obstructive uropathy within either kidney. The adrenals are grossly unremarkable. Bladder is moderately distended, with a large diverticulum off the left superolateral aspect. No filling defect. Stomach/Bowel: There is marked gaseous distension of the small bowel and colon through the level of the rectum. This is most pronounced within the sigmoid colon, which measures up to 13.3 cm in maximal diameter. Moderate retained stool and fluid within the rectal vault. I do not see any obstructing lesion. No evidence of bowel wall thickening. Vascular/Lymphatic: Diffuse atherosclerosis of the aorta and its branches. No pathologic adenopathy within the abdomen or pelvis. Reproductive: Uterus is atrophic with multiple calcifications consistent with small calcified fibroids. Minimally complex right adnexal cyst identified, measuring 6.5 x 6.1 x 4.4 cm, with a  single thin septation identified. The left ovary is not well visualized. Other: No free fluid or free intraperitoneal gas. Right inguinal hernia contains a small amount of fluid. No bowel herniation. There is extensive subcutaneous edema within the lower abdominal wall and proximal lower extremities. Musculoskeletal: No acute  or destructive bony lesions. Reconstructed images demonstrate no additional findings. IMPRESSION: 1. Diffuse gaseous distension of the large and small bowel, to the level of the rectum. No evidence of obstructing lesion. Moderate retained stool in fluid within the rectal vault consistent with history of diarrhea. 2. Complex right adnexal cyst. If the patient would be a therapy candidate should neoplasm be detected, follow-up nonemergent outpatient pelvic ultrasound could be considered. 3. Right inguinal hernia, with a small amount of fluid in the hernia sac. No bowel herniation. 4. Diffuse subcutaneous edema within the lower abdominal wall and visualized lower extremities. 5.  Aortic Atherosclerosis (ICD10-I70.0). Electronically Signed   By: Randa Ngo M.D.   On: 09/23/2021 16:52   DG Abdomen 1 View  Result Date: 09/23/2021 CLINICAL DATA:  Abdominal distension. EXAM: ABDOMEN - 1 VIEW COMPARISON:  None Available. FINDINGS: Supine view of the abdomen and pelvis shows diffuse markedly gas distended small bowel and colon. Prominent arterial vascular calcification evident. Numerous phleboliths are noted along the left pelvic sidewalls. Bones are diffusely demineralized. IMPRESSION: Diffuse marked gaseous distention of small bowel and colon. Imaging features are compatible with severe ileus although distal colonic obstruction cannot be excluded. Electronically Signed   By: Misty Stanley M.D.   On: 09/23/2021 14:21    Microbiology: Results for orders placed or performed during the hospital encounter of 04/17/20  Resp Panel by RT-PCR (Flu A&B, Covid) Nasopharyngeal Swab     Status: None   Collection Time: 04/17/20  2:53 PM   Specimen: Nasopharyngeal Swab; Nasopharyngeal(NP) swabs in vial transport medium  Result Value Ref Range Status   SARS Coronavirus 2 by RT PCR NEGATIVE NEGATIVE Final    Comment: (NOTE) SARS-CoV-2 target nucleic acids are NOT DETECTED.  The SARS-CoV-2 RNA is generally detectable in  upper respiratory specimens during the acute phase of infection. The lowest concentration of SARS-CoV-2 viral copies this assay can detect is 138 copies/mL. A negative result does not preclude SARS-Cov-2 infection and should not be used as the sole basis for treatment or other patient management decisions. A negative result may occur with  improper specimen collection/handling, submission of specimen other than nasopharyngeal swab, presence of viral mutation(s) within the areas targeted by this assay, and inadequate number of viral copies(<138 copies/mL). A negative result must be combined with clinical observations, patient history, and epidemiological information. The expected result is Negative.  Fact Sheet for Patients:  EntrepreneurPulse.com.au  Fact Sheet for Healthcare Providers:  IncredibleEmployment.be  This test is no t yet approved or cleared by the [place] FDA and  has been authorized for detection and/or diagnosis of SARS-CoV-2 by FDA under an Emergency Use Authorization (EUA). This EUA will remain  in effect (meaning this test can be used) for the duration of the COVID-19 declaration under Section 564(b)(1) of the Act, 21 U.S.C.section 360bbb-3(b)(1), unless the authorization is terminated  or revoked sooner.       Influenza A by PCR NEGATIVE NEGATIVE Final   Influenza B by PCR NEGATIVE NEGATIVE Final    Comment: (NOTE) The Xpert Xpress SARS-CoV-2/FLU/RSV plus assay is intended as an aid in the diagnosis of influenza from Nasopharyngeal swab specimens and should not be used as a sole basis for  treatment. Nasal washings and aspirates are unacceptable for Xpert Xpress SARS-CoV-2/FLU/RSV testing.  Fact Sheet for Patients: EntrepreneurPulse.com.au  Fact Sheet for Healthcare Providers: IncredibleEmployment.be  This test is not yet approved or cleared by the [place] FDA and has been  authorized for detection and/or diagnosis of SARS-CoV-2 by FDA under an Emergency Use Authorization (EUA). This EUA will remain in effect (meaning this test can be used) for the duration of the COVID-19 declaration under Section 564(b)(1) of the Act, 21 U.S.C. section 360bbb-3(b)(1), unless the authorization is terminated or revoked.  Performed at [facility], [address] [place], [place] [address]   Resp Panel by RT-PCR (Flu A&B, Covid) Nasopharyngeal Swab     Status: None   Collection Time: 04/28/20 11:57 AM   Specimen: Nasopharyngeal Swab; Nasopharyngeal(NP) swabs in vial transport medium  Result Value Ref Range Status   SARS Coronavirus 2 by RT PCR NEGATIVE NEGATIVE Final    Comment: (NOTE) SARS-CoV-2 target nucleic acids are NOT DETECTED.  The SARS-CoV-2 RNA is generally detectable in upper respiratory specimens during the acute phase of infection. The lowest concentration of SARS-CoV-2 viral copies this assay can detect is 138 copies/mL. A negative result does not preclude SARS-Cov-2 infection and should not be used as the sole basis for treatment or other patient management decisions. A negative result may occur with  improper specimen collection/handling, submission of specimen other than nasopharyngeal swab, presence of viral mutation(s) within the areas targeted by this assay, and inadequate number of viral copies(<138 copies/mL). A negative result must be combined with clinical observations, patient history, and epidemiological information. The expected result is Negative.  Fact Sheet for Patients:  EntrepreneurPulse.com.au  Fact Sheet for Healthcare Providers:  IncredibleEmployment.be  This test is no t yet approved or cleared by the [place] FDA and  has been authorized for detection and/or diagnosis of SARS-CoV-2 by FDA under an Emergency Use Authorization (EUA). This EUA will remain  in effect (meaning this  test can be used) for the duration of the COVID-19 declaration under Section 564(b)(1) of the Act, 21 U.S.C.section 360bbb-3(b)(1), unless the authorization is terminated  or revoked sooner.       Influenza A by PCR NEGATIVE NEGATIVE Final   Influenza B by PCR NEGATIVE NEGATIVE Final    Comment: (NOTE) The Xpert Xpress SARS-CoV-2/FLU/RSV plus assay is intended as an aid in the diagnosis of influenza from Nasopharyngeal swab specimens and should not be used as a sole basis for treatment. Nasal washings and aspirates are unacceptable for Xpert Xpress SARS-CoV-2/FLU/RSV testing.  Fact Sheet for Patients: EntrepreneurPulse.com.au  Fact Sheet for Healthcare Providers: IncredibleEmployment.be  This test is not yet approved or cleared by the [place] FDA and has been authorized for detection and/or diagnosis of SARS-CoV-2 by FDA under an Emergency Use Authorization (EUA). This EUA will remain in effect (meaning this test can be used) for the duration of the COVID-19 declaration under Section 564(b)(1) of the Act, 21 U.S.C. section 360bbb-3(b)(1), unless the authorization is terminated or revoked.  Performed at [facility], [address] [place], [place] [address]     Labs: CBC: Recent Labs  Lab 09/23/21 1421 09/25/21 0825 09/26/21 0538 09/27/21 0439  WBC 4.2 6.5 7.8 5.7  HGB 11.5* 10.4* 10.9* 10.0*  HCT 36.4 31.2* 32.6* 29.9*  MCV 98.6 94.3 92.9 93.7  PLT 185 167 176 330   Basic Metabolic Panel: Recent Labs  Lab 09/24/21 0443 09/24/21 1308 09/25/21 0825 09/25/21 1943 09/26/21 0538 09/27/21 0762  NA 149* 146* 142  --  134* 139  K 2.7* 3.8 2.4* 3.1* 3.2* 3.2*  CL 127* 127* 123*  --  116* 119*  CO2 14* 12* 15*  --  14* 16*  GLUCOSE 58* 123* 106*  --  148* 68*  BUN 53* 43* 33*  --  26* 23  CREATININE 1.79* 1.83* 1.51*  --  1.45* 1.49*  CALCIUM 8.8* 8.7* 8.1*  --  7.7* 7.8*  MG  --  2.2 1.9  --   --   --    PHOS  --   --  1.6*  --   --   --    Liver Function Tests: Recent Labs  Lab 09/23/21 1421  AST 24  ALT 11  ALKPHOS 52  BILITOT 0.7  PROT 6.5  ALBUMIN 3.7   CBG: Recent Labs  Lab 09/24/21 0545 09/24/21 0612 09/24/21 1042 09/24/21 1111  GLUCAP 51* 127* 54* 159*    Discharge time spent: greater than 30 minutes.  Signed: Max Sane, MD [facility] 09/27/2021

## 2021-09-27 NOTE — TOC Transition Note (Signed)
Transition of Care [facility]) - CM/SW Discharge Note   Patient Details  Name: Sonya Craig MRN: [id number] Date of Birth: 10-08-1927  [facility] CM/SW Contact:  Beverly Sessions, RN Phone Number: 09/27/2021, 11:22 AM   Clinical Narrative:     Patient will DC to: [facility] Anticipated DC date: 09/27/21   Family notified: Son Transport [id number]  Per MD patient ready for DC to . RN, patient, patient's family, and facility notified of DC. Discharge Summary sent to facility. RN given number for report. DC packet on chart. Ambulance transport requested for patient.  TOC signing off.  Isaias Cowman [facility] [phone]   Final next level of care: Skilled Nursing Facility Barriers to Discharge: Continued Medical Work up   Patient Goals and CMS Choice Patient states their goals for this hospitalization and ongoing recovery are:: Per daughter, return to [facility] [employer] Compare Post Acute Care list provided to:: Patient Represenative (must comment) (Daughter [name]) Choice offered to / list presented to : Adult Children  Discharge Placement                       Discharge Plan and Services     Post Acute Care Choice: Skilled Nursing Facility                               Social Determinants of Health (SDOH) Interventions     Readmission Risk Interventions     No data to display

## 2021-09-27 NOTE — Progress Notes (Signed)
Report called to Science writer at [facility]. EMS called to transport patient back to facility.

## 2021-10-03 LAB — BASIC METABOLIC PANEL
BUN: 26 — AB (ref 4–21)
CO2: 19 (ref 13–22)
Chloride: 113 — AB (ref 99–108)
Creatinine: 1.3 — AB (ref 0.5–1.1)
Glucose: 66
Potassium: 3.3 mEq/L — AB (ref 3.5–5.1)
Sodium: 138 (ref 137–147)

## 2021-10-03 LAB — COMPREHENSIVE METABOLIC PANEL
Albumin: 2.8 — AB (ref 3.5–5.0)
Calcium: 8.3 — AB (ref 8.7–10.7)
eGFR: 39

## 2021-10-03 LAB — HEPATIC FUNCTION PANEL: Alkaline Phosphatase: 3.7 — AB (ref 25–125)

## 2021-11-22 ENCOUNTER — Non-Acute Institutional Stay (SKILLED_NURSING_FACILITY): Payer: Medicare Other | Admitting: Nurse Practitioner

## 2021-11-22 ENCOUNTER — Encounter: Payer: Self-pay | Admitting: Nurse Practitioner

## 2021-11-22 DIAGNOSIS — E43 Unspecified severe protein-calorie malnutrition: Secondary | ICD-10-CM | POA: Diagnosis not present

## 2021-11-22 DIAGNOSIS — I10 Essential (primary) hypertension: Secondary | ICD-10-CM | POA: Diagnosis not present

## 2021-11-22 DIAGNOSIS — E876 Hypokalemia: Secondary | ICD-10-CM | POA: Diagnosis not present

## 2021-11-22 DIAGNOSIS — N184 Chronic kidney disease, stage 4 (severe): Secondary | ICD-10-CM

## 2021-11-22 NOTE — Progress Notes (Unsigned)
Location:   [place] Room Number: 810 Place of Service:  SNF [phone]) Provider:  Sherrie Mustache, NP  Dewayne Shorter, MD  Patient Care Team: Dewayne Shorter, MD as PCP - General [facility] Medicine)  Extended Emergency Contact Information Primary Emergency Contact: Derrek Monaco Address: [address]          [place], [place] [address] Johnnette Litter of [place] Work Phone: [phone] Mobile Phone: [phone] Relation: Daughter Secondary Emergency Contact: [place] of [place] Phone: [phone] Relation: Daughter  Code Status:  DNR Goals of care: Advanced Directive information    11/22/2021    2:31 PM  Advanced Directives  Does Patient Have a Medical Advance Directive? Yes  Type of Advance Directive Out of facility DNR (pink MOST or yellow form)  Does patient want to make changes to medical advance directive? No - Patient declined  Pre-existing out of facility DNR order (yellow form or pink MOST form) Pink MOST form placed in chart (order not valid for inpatient use)     Chief Complaint  Patient presents with   Medical Management of Chronic Issues    Routine follow up   Immunizations    Tetanus/tdap, pneumonia, shingrix and flu vaccine due.   Quality Metric Gaps    Dexa scan    HPI:  Pt is a 88 y.o. female seen today for medical management of chronic diseases.   Pt with hx of htn, hyperlipidemia, ckd, DM She is mostly bed bound by choice.  She has a great support system with her daughters who are at bed side.  Reports she does not like food at facility so daughters bring her meals  She has not been drinking supplement  No complaints or concerns at this time.    Past Medical History:  Diagnosis Date   Diabetes mellitus without complication ([place])    Hypercholesterolemia    Hypertension    Stage 3 chronic kidney disease due to type 2 diabetes mellitus [facility])    Past Surgical History:  Procedure  Laterality Date   COLONOSCOPY N/A 09/24/2021   Procedure: COLONOSCOPY/BOWEL DECOMPRESSION;  Surgeon: [name], MD;  Location: [facility] ENDOSCOPY;  Service: Endoscopy;  Laterality: N/A;    No Known Allergies  Allergies as of 11/22/2021   No Known Allergies      Medication List        Accurate as of November 22, 2021  2:32 PM. If you have any questions, ask your nurse or doctor.          STOP taking these medications    lactose free nutrition Liqd Stopped by: Lauree Chandler, NP   ondansetron 4 MG disintegrating tablet Commonly known as: ZOFRAN-ODT Stopped by: Lauree Chandler, NP       TAKE these medications    acetaminophen 325 MG tablet Commonly known as: TYLENOL Take 2 tablets (650 mg total) by mouth every 6 (six) hours as needed for mild pain (or Fever >/= 101).   aluminum hydroxide ointment Commonly known as: DERMAGRAN Apply topically as needed. Apply to buttocks topically as needed for open area left buttocks   Diclofenac Sodium 1.5 % Soln APPLY 40 DROPS TOPICALLY 4 TIMES A DAY TO AFFECTED AREA   lisinopril 20 MG tablet Commonly known as: ZESTRIL Take 1 tablet (20 mg total) by mouth daily.   potassium chloride SA 20 MEQ tablet Commonly known as: KLOR-CON M Take 20 mEq by mouth 2 (two) times daily.  Review of Systems  Immunization History  Administered Date(s) Administered   Influenza Inj Mdck Quad Pf 12/14/2017   Influenza,inj,Quad PF,6+ Mos 11/07/2019   Zoster Recombinat (Shingrix) 12/14/2017   Pertinent  Health Maintenance Due  Topic Date Due   DEXA SCAN  Never done   INFLUENZA VACCINE  09/20/2021      09/25/2021   10:00 AM 09/25/2021    9:00 PM 09/26/2021    8:00 AM 09/26/2021    8:36 PM 09/27/2021    9:00 AM  Fall Risk  Patient Fall Risk Level High fall risk High fall risk High fall risk High fall risk High fall risk   Functional Status Survey:    Vitals:   11/22/21 1422  BP: 126/61  Pulse: 68  Resp: 17  Temp: (!) 97.3 F  (36.3 C)  SpO2: 99%  Weight: 116 lb (52.6 kg)  Height: 5\' 3"  (1.6 m)   Body mass index is 20.55 kg/m. Physical Exam  Labs reviewed: Recent Labs    09/24/21 1308 09/25/21 0825 09/25/21 1943 09/26/21 0538 09/27/21 0439 10/03/21 0000  NA 146* 142  --  134* 139 138  K 3.8 2.4*   < > 3.2* 3.2* 3.3*  CL 127* 123*  --  116* 119* 113*  CO2 12* 15*  --  14* 16* 19  GLUCOSE 123* 106*  --  148* 68*  --   BUN 43* 33*  --  26* 23 26*  CREATININE 1.83* 1.51*  --  1.45* 1.49* 1.3*  CALCIUM 8.7* 8.1*  --  7.7* 7.8* 8.3*  MG 2.2 1.9  --   --   --   --   PHOS  --  1.6*  --   --   --   --    < > = values in this interval not displayed.   Recent Labs    05/23/21 0000 09/22/21 0000 09/23/21 1421 10/03/21 0000  AST  --  17 24  --   ALT  --  7 11  --   ALKPHOS  --  47 52 3.7*  BILITOT  --   --  0.7  --   PROT  --   --  6.5  --   ALBUMIN 3.0*  --  3.7 2.8*   Recent Labs    09/25/21 0825 09/26/21 0538 09/27/21 0439  WBC 6.5 7.8 5.7  HGB 10.4* 10.9* 10.0*  HCT 31.2* 32.6* 29.9*  MCV 94.3 92.9 93.7  PLT 167 176 158   Lab Results  Component Value Date   TSH 0.956 04/17/2020   Lab Results  Component Value Date   HGBA1C 4.9 06/21/2021   No results found for: "CHOL", "HDL", "LDLCALC", "LDLDIRECT", "TRIG", "CHOLHDL"  Significant Diagnostic Results in last 30 days:  No results found.  Assessment/Plan 1. Hypokalemia -will follow up bmp -continues on supplement  2. Primary hypertension -Blood pressure well controlled, goal bp <140/90 Continue current medications and dietary modifications follow metabolic panel  3. Protein-calorie malnutrition, severe Encouraged to eat 3 meals a day -encouraged to start to take the meal supplement that is offered -expect further decline if she continues to not have proper oral intake and this was discussed with pt and family   4. CKD (chronic kidney disease), stage IV (HCC) -Chronic and stable Encourage proper hydration Follow  metabolic panel Avoid nephrotoxic meds (NSAIDS)   Yentl Verge K. [place], [facility] [phone]

## 2021-11-24 LAB — HEPATIC FUNCTION PANEL
ALT: 6 U/L — AB (ref 7–35)
AST: 13 (ref 13–35)
Alkaline Phosphatase: 40 (ref 25–125)
Bilirubin, Total: 0.3

## 2021-11-24 LAB — BASIC METABOLIC PANEL
BUN: 48 — AB (ref 4–21)
CO2: 19 (ref 13–22)
Chloride: 115 — AB (ref 99–108)
Creatinine: 1.6 — AB (ref 0.5–1.1)
Glucose: 69
Potassium: 4.6 mEq/L (ref 3.5–5.1)
Sodium: 139 (ref 137–147)

## 2021-11-24 LAB — COMPREHENSIVE METABOLIC PANEL
Albumin: 3.3 — AB (ref 3.5–5.0)
Calcium: 8.8 (ref 8.7–10.7)
Globulin: 2.4
eGFR: 30

## 2021-12-07 ENCOUNTER — Non-Acute Institutional Stay: Payer: Medicare Other | Admitting: Student

## 2021-12-07 DIAGNOSIS — R5381 Other malaise: Secondary | ICD-10-CM

## 2021-12-07 DIAGNOSIS — R531 Weakness: Secondary | ICD-10-CM

## 2021-12-07 DIAGNOSIS — E43 Unspecified severe protein-calorie malnutrition: Secondary | ICD-10-CM

## 2021-12-08 NOTE — Progress Notes (Signed)
[facility] Consult Note Telephone: [phone]  Fax: [phone]    Date of encounter:12/07/2021  PATIENT NAME: Sonya Craig [address] [address]   [phone] (home)  DOB: 08/19/1927 MRN: [id number] PRIMARY CARE PROVIDER:    Dewayne Shorter, MD,  [place] [address] [phone]  REFERRING PROVIDER:   Dewayne Shorter, MD [place],  [place] [address] [phone]  RESPONSIBLE PARTY:    Contact Information     Name Relation Home Work Raymond Tapp Daughter  [phone]   [name] Daughter [phone]     Littles,Laderitca Daughter   [phone]        I met face to face with patient in the facility. Palliative Care was asked to follow this patient by consultation request of  Dewayne Shorter, MD to address advance care planning and complex medical decision making. This is a follow up visit.                                   ASSESSMENT AND PLAN / RECOMMENDATIONS:   Advance Care Planning/Goals of Care: Goals include to maximize quality of life and symptom management. Patient/health care surrogate gave his/her permission to discuss. CODE STATUS: DNR  Symptom Management/Plan:  Protein calorie malnutrition- Albumin 2.8. Patient with fair appetite; she hs been eating 50% of most meals, last weight on file is 116 pounds; which is actual weight gain. She is eating foods she enjoys, food brought in by family. She often declines nutritional supplements; recommend BID. Mirtazapine 7.5 mg QHS. Will continue to monitor for weight loss.    Generalized weakness, debility-patient stays in bed per her preference. Staff will continue to assist with adl's. Turn and reposition for comfort; encourage heels to be elevated.  Follow up Palliative Care Visit: Palliative care will continue to follow for complex medical decision making, advance care planning, and  clarification of goals. Return in 8 weeks or prn.   This visit was coded based on medical decision making (MDM).  PPS: 30%  HOSPICE ELIGIBILITY/DIAGNOSIS: TBD  Chief Complaint: Palliative medicine follow-up visit.  HISTORY OF PRESENT ILLNESS:  Sonya Craig is a 88 y.o. year old female  with protein calorie malnutrition, hypertension, CKD stage 4, T2DM, debility, generalized weakness, OA.  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   Patient resides at [facility]. Patient has been stable per facility staff. She is eating about 50% of meals. Last weight on file is 116 pounds; which shows weight gain. Patient is bed bound; she declines offers to get out of bed. She will state "next time" when asked if she is going to get out of bed. She denies pain, shortness of breath, nausea, constipation. She is sleeping well. No skin breakdown reported; She does have dressing on sacrum for protection. No recent falls, infections reported.    History obtained from review of EMR, discussion with primary team, and interview with family, facility staff/caregiver and/or Ms. Hassan.  I reviewed available labs, medications, imaging, studies and related documents from the EMR.  Records reviewed and summarized above.   ROS  A 10 point review of systems is negative, except for the pertinent positives and negatives detailed in the HPI.    Physical Exam: Pulse 64, resp 20, sats 96% on room air  Constitutional: NAD General: frail appearing, thin EYES: anicteric sclera, lids intact, no discharge  ENMT: intact hearing, oral mucous membranes moist CV: S1S2, RRR, 1+LE edema Pulmonary: LCTA, no increased work of breathing, no cough, room air Abdomen: normo-active BS + 4 quadrants, soft and non tender GU: deferred MSK: sarcopenia, moves all extremities, non-ambulatory Skin: warm and dry, no  rashes or wounds on visible skin Neuro: + generalized weakness Psych: non-anxious affect, A and O x 3 Hem/lymph/immuno: no widespread bruising   Thank you for the opportunity to participate in the care of Sonya Craig.  The palliative care team will continue to follow. Please call our office at [phone] if we can be of additional assistance.   Ezekiel Slocumb, NP   COVID-19 PATIENT SCREENING TOOL Asked and negative response unless otherwise noted:   Have you had symptoms of covid, tested positive or been in contact with someone with symptoms/positive test in the past 5-10 days? No

## 2021-12-22 LAB — BASIC METABOLIC PANEL
BUN: 35 — AB (ref 4–21)
CO2: 20 (ref 13–22)
Chloride: 114 — AB (ref 99–108)
Creatinine: 1.7 — AB (ref 0.5–1.1)
Glucose: 111
Potassium: 3.9 mEq/L (ref 3.5–5.1)
Sodium: 140 (ref 137–147)

## 2021-12-22 LAB — CBC: RBC: 3.17 — AB (ref 3.87–5.11)

## 2021-12-22 LAB — HEPATIC FUNCTION PANEL
ALT: 4 U/L — AB (ref 7–35)
AST: 15 (ref 13–35)
Alkaline Phosphatase: 45 (ref 25–125)
Bilirubin, Total: 0.3

## 2021-12-22 LAB — LIPID PANEL
Cholesterol: 147 (ref 0–200)
HDL: 40 (ref 35–70)
LDL Cholesterol: 89
Triglycerides: 88 (ref 40–160)

## 2021-12-22 LAB — CBC AND DIFFERENTIAL
HCT: 31 — AB (ref 36–46)
Hemoglobin: 10.1 — AB (ref 12.0–16.0)
Neutrophils Absolute: 1695
Platelets: 178 10*3/uL (ref 150–400)
WBC: 3.8

## 2021-12-22 LAB — COMPREHENSIVE METABOLIC PANEL
Albumin: 3.3 — AB (ref 3.5–5.0)
Calcium: 8.7 (ref 8.7–10.7)
Globulin: 2.4
eGFR: 28

## 2021-12-22 LAB — HEMOGLOBIN A1C: Hemoglobin A1C: 5.3

## 2021-12-23 IMAGING — DX DG CHEST 1V PORT
1 series · 1 of 1 positions shown · non-contrast
Comparison: None.

CLINICAL DATA: Hypercalcemia

EXAM:
PORTABLE CHEST 1 VIEW

[chest ap]
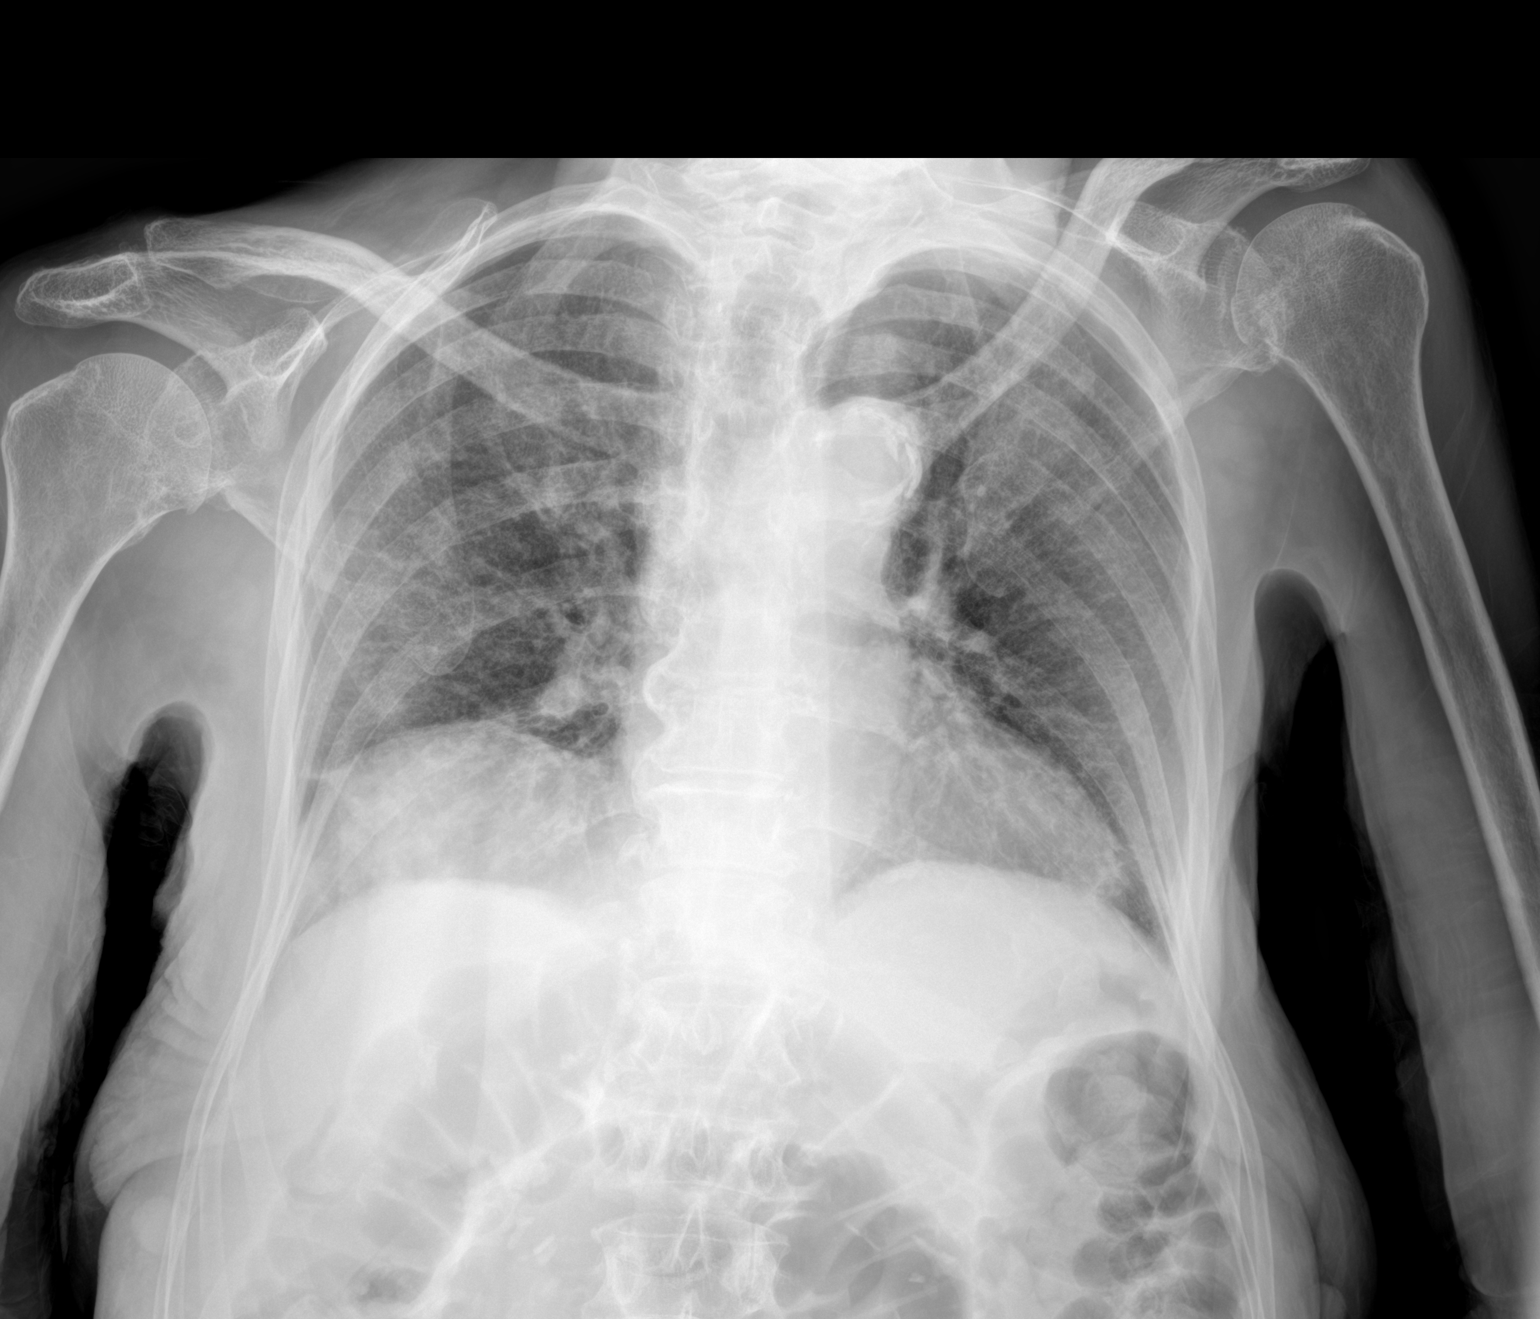

[1 of 1 positions shown; findings below may reference images not displayed]

FINDINGS: Eventration of the right hemidiaphragm. No confluent airspace
opacities or effusions. Heart is normal size. Aortic
atherosclerosis.
IMPRESSION: No active disease.

## 2021-12-24 IMAGING — CT CT HEAD W/O CM
4 series · 16 of 47 positions shown, 18 images · non-contrast
Comparison: 04/17/2020, 08/25/2019

CLINICAL DATA: Trauma, scalp hematoma

EXAM:
CT HEAD WITHOUT CONTRAST
TECHNIQUE: Contiguous axial images were obtained from the base of the skull
through the vertex without intravenous contrast.

[Series 2: head bone · axial · 0.43mm/px · z∈[+423,+455]mm · 3 of 79 slices shown]
[im 8/79  bone]
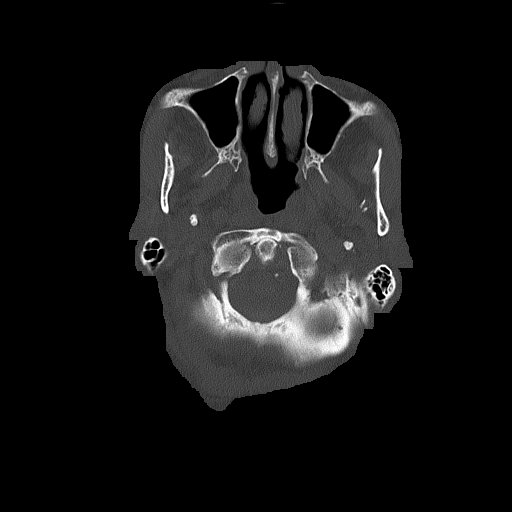
[im 16/79  bone]
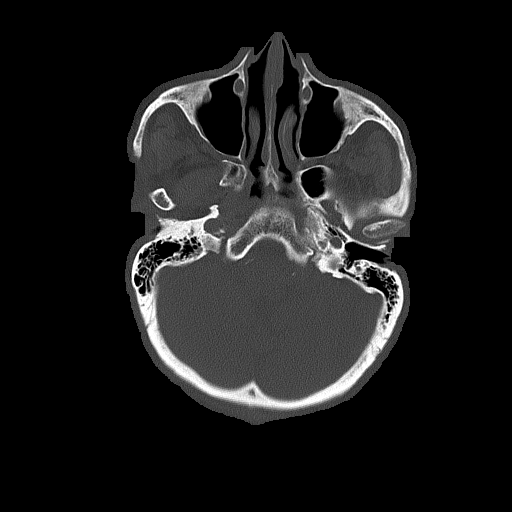
[im 24/79  bone]
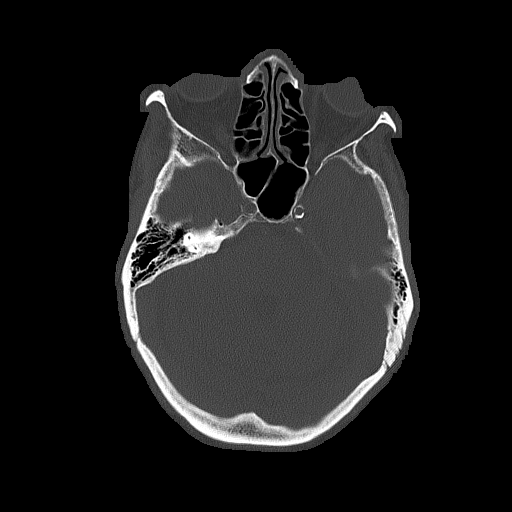

[Series 3: head wo · axial · 0.43mm/px · z∈[+424,+544]mm · 7 of 32 slices shown, 9 images]
[im 4/32  brain]
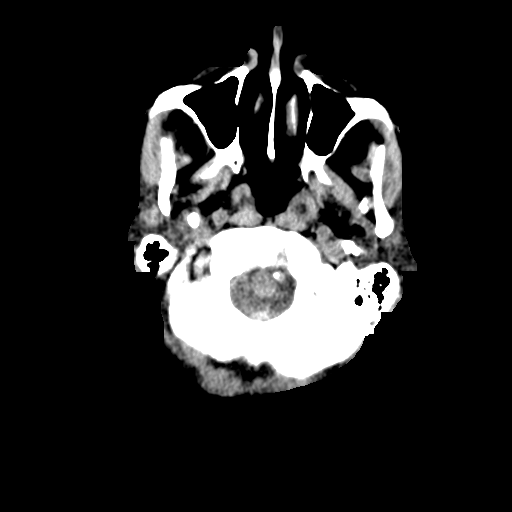
[im 4/32  bone]
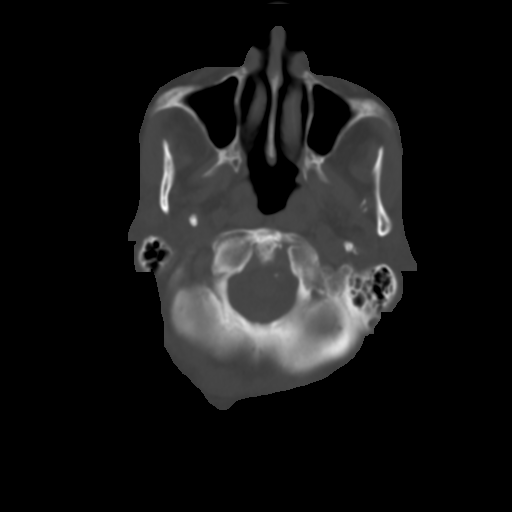
[im 8/32  brain]
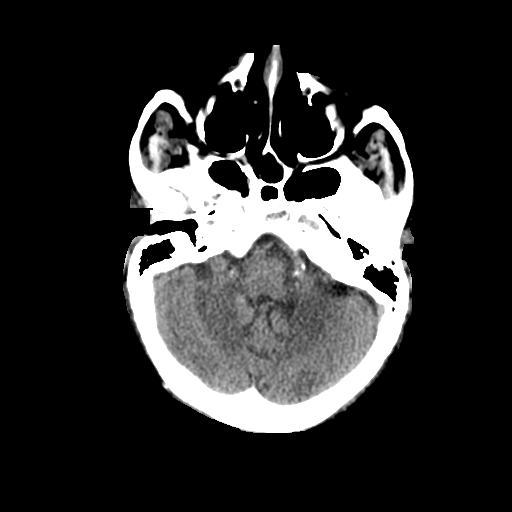
[im 12/32  brain]
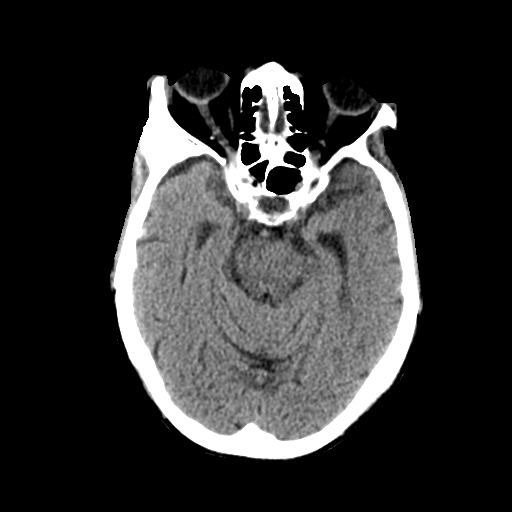
[im 16/32  brain]
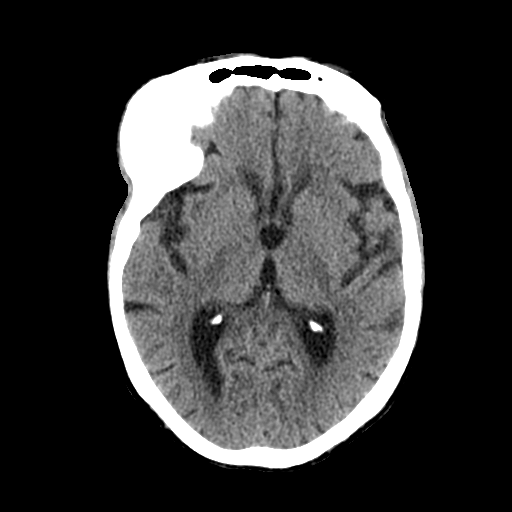
[im 20/32  brain]
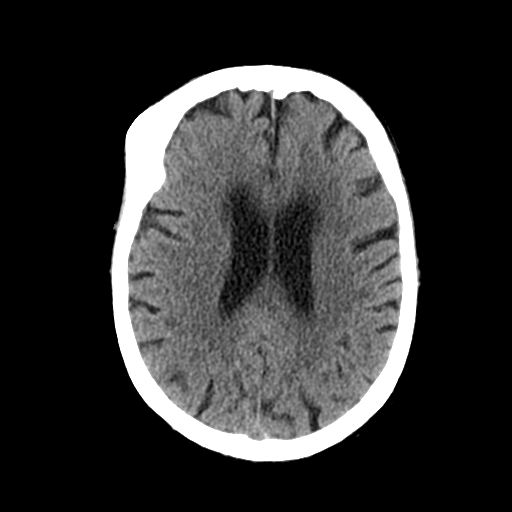
[im 20/32  bone]
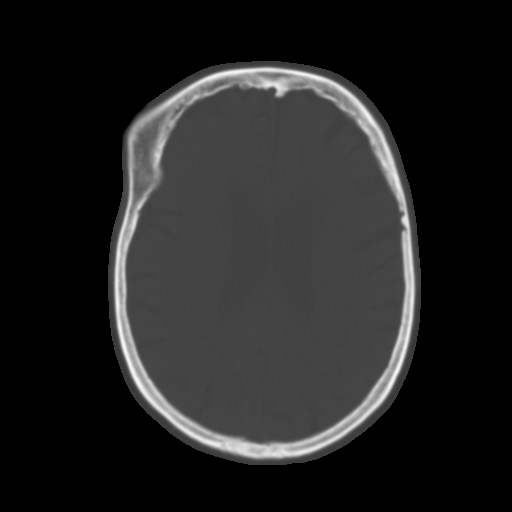
[im 24/32  brain]
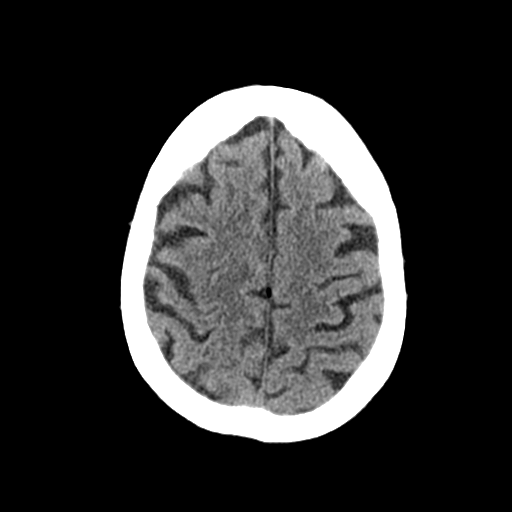
[im 28/32  brain]
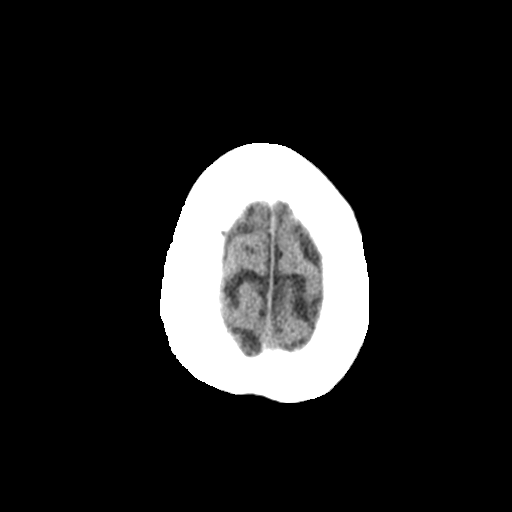

[Series 4: coronal soft tissue · coronal · 0.31mm/px · 3 of 65 slices shown]
[im 22/65  brain]
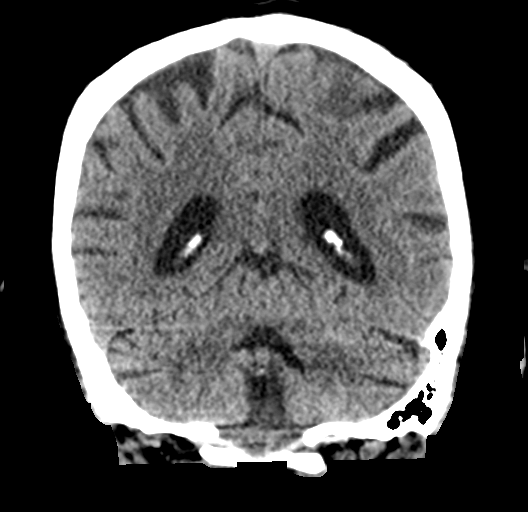
[im 29/65  brain]
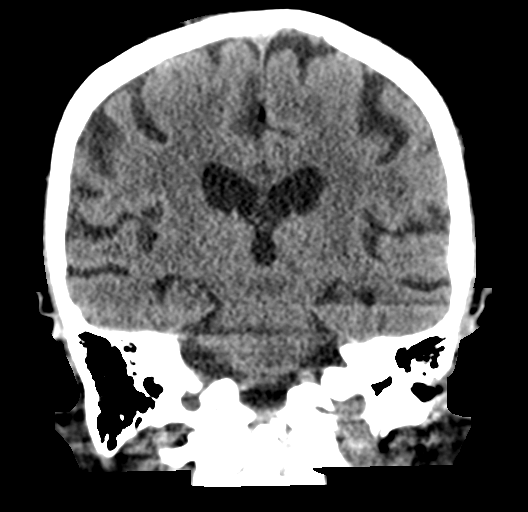
[im 36/65  brain]
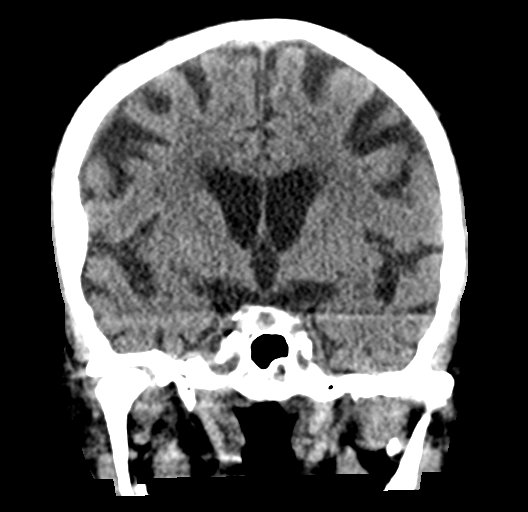

[Series 5: sagittal soft tissue · sagittal · 0.31mm/px · 3 of 56 slices shown]
[im 19/56  brain]
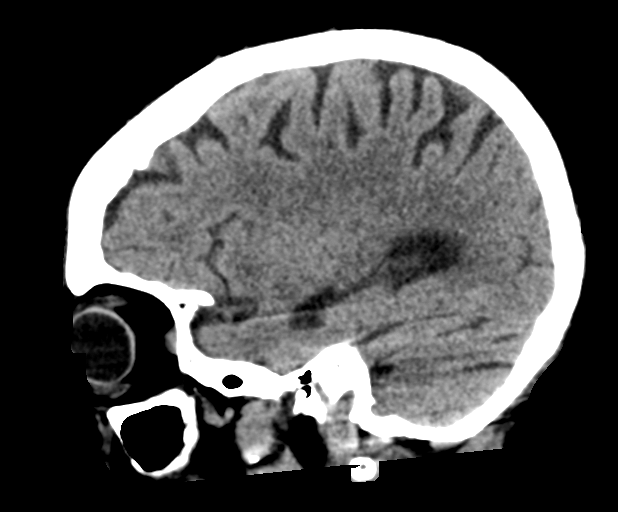
[im 28/56  brain]
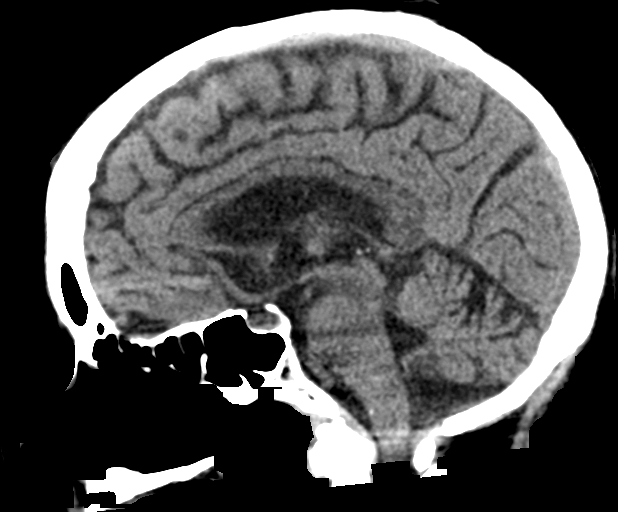
[im 37/56  brain]
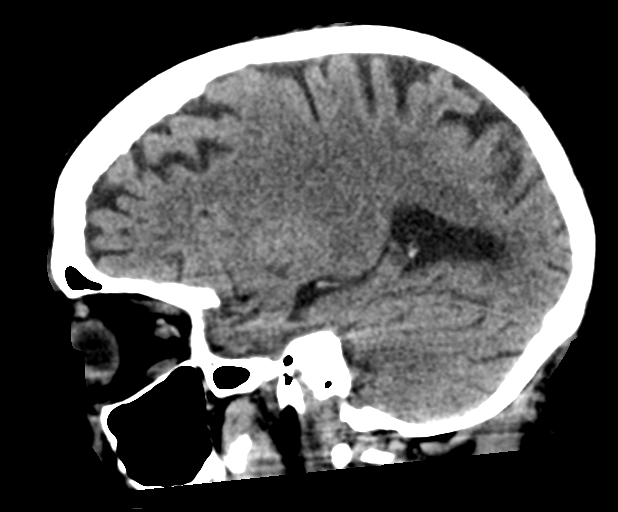

[16 of 47 positions shown; findings below may reference images not displayed]

FINDINGS: Brain: Normal anatomic configuration. Parenchymal volume loss is
commensurate with the patient's age. periventricular white matter
changes are present likely reflecting the sequela of small vessel
ischemia. No abnormal intra or extra-axial mass lesion or fluid
collection. No abnormal mass effect or midline shift. No evidence of
acute intracranial hemorrhage or infarct. Ventricular size is
normal. Cerebellum unremarkable.

Vascular: No asymmetric hyperdense vasculature at the skull base.

Skull: Intact. There is expansion of the right lateral wall of the
orbit and right frontal bone in keeping with changes of a focal
fibrous dysplasia.

Sinuses/Orbits: Paranasal sinuses are clear. Orbits are
unremarkable.

Other: Mastoid air cells and middle ear cavities are clear. No
significant scalp hematoma identified.
IMPRESSION: No acute intracranial abnormality.

Stable changes of a focal fibrous dysplasia involving the right
lateral wall of the orbit and right frontal bone. This may account
for the reported clinical abnormality involving the right frontal
region.

## 2021-12-30 ENCOUNTER — Encounter: Payer: Self-pay | Admitting: Student

## 2022-01-24 ENCOUNTER — Non-Acute Institutional Stay: Payer: Medicare Other | Admitting: Hospice

## 2022-01-24 DIAGNOSIS — I10 Essential (primary) hypertension: Secondary | ICD-10-CM

## 2022-01-24 DIAGNOSIS — E440 Moderate protein-calorie malnutrition: Secondary | ICD-10-CM

## 2022-01-24 DIAGNOSIS — R531 Weakness: Secondary | ICD-10-CM

## 2022-01-24 DIAGNOSIS — Z515 Encounter for palliative care: Secondary | ICD-10-CM

## 2022-01-24 NOTE — Progress Notes (Signed)
    [facility] Consult Note Telephone: [phone]  Fax: [phone]    Date of encounter:12/07/2021  PATIENT NAME: Sonya Craig [address] [address]   [phone] (home)  DOB: 09/01/1927 MRN: [id number] PRIMARY CARE PROVIDER:    Dewayne Shorter, MD,  [place] [address] [phone]  REFERRING PROVIDER:   Dewayne Shorter, MD [place],  [place] [address] [phone]  RESPONSIBLE PARTY:    Contact Information     Name Relation Home Work Erie Tapp Daughter  [phone]   [name] Daughter [phone]     Littles,Laderitca Daughter   [phone]        I met face to face with patient in the facility. Palliative Care was asked to follow this patient by consultation request of  Dewayne Shorter, MD to address advance care planning and complex medical decision making. This is a follow up visit. Kieth Brightly is with patient during visit.  ASSESSMENT AND PLAN / RECOMMENDATIONS:   Advance Care Planning/Goals of Care: Goals include to maximize quality of life and symptom management. Patient/health care surrogate gave his/her permission to discuss. CODE STATUS: DNR  Symptom Management/Plan: Protein calorie malnutrition- Current Albumin is 3.3 12/22/21 up from Albumin 2.8 reported by [facility] NP in Oct 23. Current weight is 116 pounds. Continue to provide assistance during meals to ensure adequate oral intake. Ensure BID. Initiate  Mirtazapine 7.5 mg QHS to help boost appetite. . Will continue to monitor for weight loss.  Hypertension: Managed with Lisinopril.  Weakness: weakness persisting despite completion of physical therapy. Out of bed daily; patient stays in bed per her preference. Staff will continue to assist with adl's. Turn and reposition for comfort; encourage heels to be elevated.  Follow up Palliative Care Visit: Palliative care will continue to  follow for complex medical decision making, advance care planning, and clarification of goals. Return in 8 weeks or prn.  PPS: 30%  HOSPICE ELIGIBILITY/DIAGNOSIS: TBD  Chief Complaint: Palliative medicine follow-up visit.  HISTORY OF PRESENT ILLNESS:  RAIGAN BARIA is a 88 y.o. year old female with multiple morbidities requiring close monitoring/management with high risk of complications and morbidity:  protein calorie malnutrition, hypertension, CKD stage 4, T2DM, debility, generalized weakness, OA.  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 History obtained from review of EMR, discussion with primary team, family and/or patient. Records reviewed and summarized above. All 10 point systems reviewed and are negative except as documented in history of present illness above .  I spent 45 minutes providing this consultation; this includes time spent with patient/family, chart review and documentation. More than 50% of the time in this consultation was spent on counseling and coordinating communication   Thank you for the opportunity to participate in the care of Ms. Gowen.  The palliative care team will continue to follow. Please call our office at [phone] if we can be of additional assistance.   Teodoro Spray, NP

## 2022-01-30 ENCOUNTER — Non-Acute Institutional Stay (SKILLED_NURSING_FACILITY): Payer: Medicare Other | Admitting: Student

## 2022-01-30 ENCOUNTER — Encounter: Payer: Self-pay | Admitting: Student

## 2022-01-30 DIAGNOSIS — I10 Essential (primary) hypertension: Secondary | ICD-10-CM | POA: Diagnosis not present

## 2022-01-30 DIAGNOSIS — M810 Age-related osteoporosis without current pathological fracture: Secondary | ICD-10-CM | POA: Diagnosis not present

## 2022-01-30 DIAGNOSIS — N184 Chronic kidney disease, stage 4 (severe): Secondary | ICD-10-CM

## 2022-01-30 DIAGNOSIS — K5981 Ogilvie syndrome: Secondary | ICD-10-CM | POA: Diagnosis not present

## 2022-01-30 DIAGNOSIS — I779 Disorder of arteries and arterioles, unspecified: Secondary | ICD-10-CM | POA: Diagnosis not present

## 2022-01-30 DIAGNOSIS — R5381 Other malaise: Secondary | ICD-10-CM

## 2022-01-30 DIAGNOSIS — E43 Unspecified severe protein-calorie malnutrition: Secondary | ICD-10-CM

## 2022-01-30 NOTE — Progress Notes (Unsigned)
Location:  Other [place] Room Number: [place] 302A Place of Service:  SNF [phone]) Provider:  Dr. Amada Kingfisher, MD  Patient Care Team: Dewayne Shorter, MD as PCP - General [facility] Medicine)  Extended Emergency Contact Information Primary Emergency Contact: Derrek Monaco Address: [address]          [place], [place] [address] Johnnette Litter of [place] Work Phone: [phone] Mobile Phone: [phone] Relation: Daughter Secondary Emergency Contact: [place] of [place] Phone: [phone] Relation: Daughter  Code Status:  DNR Goals of care: Advanced Directive information    01/30/2022   10:17 AM  Advanced Directives  Does Patient Have a Medical Advance Directive? Yes  Type of Advance Directive Out of facility DNR (pink MOST or yellow form)  Does patient want to make changes to medical advance directive? No - Patient declined     Chief Complaint  Patient presents with   Medical Management of Chronic Issues    Medical Management of Chronic Issues.     HPI:  Pt is a 88 y.o. female seen today for medical management of chronic diseases.    She is oriented to self and place. She has no medical concerns today. Describes her previous hospitalizations and says her "feet aren't there." When looking at feet asked if she could clarify, she states she can't walk like she could before she got really sick.   She knows we are in a rest home in [place]. December 2023.   She is from [facility]. She was living with her daughter and thinks she has lived here about 2 year.   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   Patient is bed and wheelchair bound and can offer minimal assistance with transfers. Requires assistance with ADLs except feeding.  Past Medical History:  Diagnosis Date   Diabetes mellitus without complication ([place])    Hypercholesterolemia    Hypertension    Stage 3 chronic kidney disease due  to type 2 diabetes mellitus [facility])    Past Surgical History:  Procedure Laterality Date   COLONOSCOPY N/A 09/24/2021   Procedure: COLONOSCOPY/BOWEL DECOMPRESSION;  Surgeon: [name], MD;  Location: [facility] ENDOSCOPY;  Service: Endoscopy;  Laterality: N/A;    No Known Allergies  Outpatient Encounter Medications as of 01/30/2022  Medication Sig   acetaminophen (TYLENOL) 325 MG tablet Take 2 tablets (650 mg total) by mouth every 6 (six) hours as needed for mild pain (or Fever >/= 101).   Infant Care Products [facility]) OINT Apply to sacrum,coccyx,buttocks topically as needed for redness.   Lidocaine HCl (ASPERCREME LIDOCAINE) 4 % CREA Apply to knees topically every 24 hours as needed for pain.   lisinopril (ZESTRIL) 20 MG tablet Take 1 tablet (20 mg total) by mouth daily.   potassium chloride SA (KLOR-CON M) 20 MEQ tablet Take 20 mEq by mouth 2 (two) times daily.   simethicone (MYLICON) 80 MG chewable tablet Chew 80 mg by mouth every 6 (six) hours as needed for flatulence.   [DISCONTINUED] aluminum hydroxide (DERMAGRAN) ointment Apply topically as needed. Apply to buttocks topically as needed for open area left buttocks   [DISCONTINUED] Diclofenac Sodium 1.5 % SOLN APPLY 40 DROPS TOPICALLY 4 TIMES A DAY TO AFFECTED AREA   No facility-administered encounter medications on file as of 01/30/2022.    Review of Systems  All other systems reviewed and are negative.   Immunization History  Administered Date(s) Administered   Influenza Inj  Mdck Quad Pf 12/14/2017   Influenza,inj,Quad PF,6+ Mos 11/07/2019   Influenza-Unspecified 12/06/2021   Unspecified SARS-COV-2 Vaccination 12/30/2021   Zoster Recombinat (Shingrix) 12/14/2017   Pertinent  Health Maintenance Due  Topic Date Due   DEXA SCAN  Never done   INFLUENZA VACCINE  Completed      09/25/2021   10:00 AM 09/25/2021    9:00 PM 09/26/2021    8:00 AM 09/26/2021    8:36 PM 09/27/2021    9:00 AM  Fall Risk  Patient Fall Risk Level High  fall risk High fall risk High fall risk High fall risk High fall risk   Functional Status Survey:    Vitals:   01/30/22 0956  BP: (!) 140/80  Pulse: 80  Resp: 16  Temp: 98 F (36.7 C)  SpO2: 95%  Weight: 116 lb (52.6 kg)  Height: 5' 3" (1.6 m)   Body mass index is 20.55 kg/m. Physical Exam Cardiovascular:     Rate and Rhythm: Normal rate and regular rhythm.     Pulses: Normal pulses.     Heart sounds: Normal heart sounds.  Pulmonary:     Effort: Pulmonary effort is normal.     Breath sounds: Normal breath sounds.  Abdominal:     General: Bowel sounds are normal.     Palpations: Abdomen is soft.  Musculoskeletal:        General: No swelling.  Skin:    General: Skin is warm and dry.  Neurological:     Mental Status: She is alert and oriented to person, place, and time.     Labs reviewed: Recent Labs    09/24/21 1308 09/25/21 0825 09/25/21 1943 09/26/21 0538 09/27/21 0439 10/03/21 0000 11/24/21 0000 12/22/21 0000  NA 146* 142  --  134* 139 138 139 140  K 3.8 2.4*   < > 3.2* 3.2* 3.3* 4.6 3.9  CL 127* 123*  --  116* 119* 113* 115* 114*  CO2 12* 15*  --  14* 16* _0 GLUCOSE 123* 106*  --  148* 68*  --   --   --   BUN 43* 33*  --  26* 23 26* 48* 35*  CREATININE 1.83* 1.51*  --  1.45* 1.49* 1.3* 1.6* 1.7*  CALCIUM 8.7* 8.1*  --  7.7* 7.8* 8.3* 8.8 8.7  MG 2.2 1.9  --   --   --   --   --   --   PHOS  --  1.6*  --   --   --   --   --   --    < > = values in this interval not displayed.   Recent Labs    09/23/21 1421 10/03/21 0000 11/24/21 0000 12/22/21 0000  AST 24  --  13 15  ALT 11  --  6* 4*  ALKPHOS 52 3.7* 40 45  BILITOT 0.7  --   --   --   PROT 6.5  --   --   --   ALBUMIN 3.7 2.8* 3.3* 3.3*   Recent Labs    09/25/21 0825 09/26/21 0538 09/27/21 0439 12/22/21 0000  WBC 6.5 7.8 5.7 3.8  NEUTROABS  --   --   --  1,695.00  HGB 10.4* 10.9* 10.0* 10.1*  HCT 31.2* 32.6* 29.9* 31*  MCV 94.3 92.9 93.7  --   PLT 167 176 158 178   Lab  Results  Component Value Date   TSH 0.956 04/17/2020  Lab Results  Component Value Date   HGBA1C 5.3 12/22/2021   Lab Results  Component Value Date   CHOL 147 12/22/2021   HDL 40 12/22/2021   LDLCALC 89 12/22/2021   TRIG 88 12/22/2021    Significant Diagnostic Results in last 30 days:  No results found.  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 Patient tolerating regular diet and having regular bowel movements. CTM.   2. Carotid artery disease, unspecified laterality, unspecified type [facility]) Patient without symptoms at this time. Continue to monitor.   3. Essential hypertension BP within goal range. Continue Zestril 20 mg daily. Hx of hypokalemia and on potassium supplementation. Most recent Potassium 3.9 on 12/2020.   4. Age-related osteoporosis without current pathological fracture No treatment at this time based on goals of care. Continue to monitor.   5. CKD (chronic kidney disease), stage IV ([place]) Most recent GFR 28. Kidney function has been down trending over the last year. Patient has never been evaluated by nephrology. Unclear if patient is a candidate for dialysis. Patient is under palliative care at this time, will need continued goals of care conversations involving patient and family. Continue q3 month labs.   eGFR  Date/Time Value Ref Range Status  12/22/2021 12:00 AM 28  Final    6. Debility 7. Protein-calorie malnutrition, severe Patient's weight uptrending in the last year with improved oral intake and protein. Continues to have low albumin levels. Continue ensure BID.     Family/ staff Communication: nursing.   Labs/tests ordered:  quarterly BMP

## 2022-02-02 ENCOUNTER — Encounter: Payer: Self-pay | Admitting: Student

## 2022-02-06 ENCOUNTER — Telehealth (SKILLED_NURSING_FACILITY): Payer: Medicare Other | Admitting: Student

## 2022-02-06 DIAGNOSIS — N184 Chronic kidney disease, stage 4 (severe): Secondary | ICD-10-CM | POA: Diagnosis not present

## 2022-02-06 NOTE — Telephone Encounter (Signed)
Spoke with patient's daughter, [name] regarding worsening kidney function. She states she does not want to progress with dialysis, however, she would like to discuss further with her siblings. Her mother has previously stated she does not want tubes or machines to keep her alive, and feels this is against her wishes.   [name] also stated that patient stopped getting out of bed on her own desire rather than being advised to stay in bed. Continues to attempted OOB for meals, etc.   Continue goals of care conversation at next follow up visit.   Tomasa Rand, MD, [facility] [phone]

## 2022-03-02 ENCOUNTER — Non-Acute Institutional Stay (SKILLED_NURSING_FACILITY): Payer: Medicare Other | Admitting: Nurse Practitioner

## 2022-03-02 ENCOUNTER — Encounter: Payer: Self-pay | Admitting: Nurse Practitioner

## 2022-03-02 ENCOUNTER — Non-Acute Institutional Stay: Payer: 59 | Admitting: Hospice

## 2022-03-02 DIAGNOSIS — I10 Essential (primary) hypertension: Secondary | ICD-10-CM

## 2022-03-02 DIAGNOSIS — L600 Ingrowing nail: Secondary | ICD-10-CM

## 2022-03-02 DIAGNOSIS — E876 Hypokalemia: Secondary | ICD-10-CM | POA: Diagnosis not present

## 2022-03-02 DIAGNOSIS — N184 Chronic kidney disease, stage 4 (severe): Secondary | ICD-10-CM

## 2022-03-02 DIAGNOSIS — Z515 Encounter for palliative care: Secondary | ICD-10-CM

## 2022-03-02 DIAGNOSIS — E440 Moderate protein-calorie malnutrition: Secondary | ICD-10-CM

## 2022-03-02 DIAGNOSIS — R5381 Other malaise: Secondary | ICD-10-CM

## 2022-03-02 DIAGNOSIS — R531 Weakness: Secondary | ICD-10-CM

## 2022-03-02 DIAGNOSIS — D631 Anemia in chronic kidney disease: Secondary | ICD-10-CM

## 2022-03-02 DIAGNOSIS — K5981 Ogilvie syndrome: Secondary | ICD-10-CM

## 2022-03-02 NOTE — Progress Notes (Signed)
    [facility] Consult Note Telephone: [phone]  Fax: [phone]    Date of encounter:12/07/2021  PATIENT NAME: Sonya Craig [address] [address]   [phone] (home)  DOB: [birth year] MRN: [id number] PRIMARY CARE PROVIDER:    Dewayne Shorter, MD,  [place] [address] [phone]  REFERRING PROVIDER:   Dewayne Shorter, MD [place],  [place] [address] [phone]  RESPONSIBLE PARTY:    Contact Information     Name Relation Home Work [place] Tapp Daughter  [phone]   [name] Daughter [phone]     Littles,Laderitca Daughter   [phone]        I met face to face with patient in the facility. Palliative Care was asked to follow this patient by consultation request of  Dewayne Shorter, MD to address advance care planning and complex medical decision making. This is a follow up visit.  ASSESSMENT AND PLAN / RECOMMENDATIONS:   Advance Care Planning/Goals of Care: Goals include to maximize quality of life and symptom management.   CODE STATUS: DNR, Do Not Hospitalize.   Symptom Management/Plan: Protein calorie malnutrition- Continue Boost, provide assistance during meals to ensure adequate oral intake. Current weight is 116 pounds, Current Albumin is 3.3 12/22/21 . Initiate  Mirtazapine 7.5 mg QHS to help boost appetite. Monitor weight per facility protocol for weight loss.  Routine CBC CMP. Hypertension: Managed with Lisinopril.  Weakness: weakness persisting despite completion of physical therapy. Nursing reports she refuses to get out of bed.  Patient affirmed and said it is her choice. Education  provided on need for activity/Out of bed daily. Staff will continue to assist with adl's. Turn and reposition for comfort; encourage heels to be elevated. Debility: secondary advance age, multiple morbidities of hypertension, CKD 4, PCM,  Ogilvie syndrome. Fall precautions.   Follow up Palliative Care Visit: Palliative care will continue to follow for complex medical decision making, advance care planning, and clarification of goals. Return in 8 weeks or prn.  PPS: 30%  HOSPICE ELIGIBILITY/DIAGNOSIS: TBD  Chief Complaint: Palliative medicine follow-up visit.  HISTORY OF PRESENT ILLNESS:  Sonya Craig is a 89 y.o. year old female with multiple morbidities requiring close monitoring/management with high risk of complications and morbidity:  protein calorie malnutrition, hypertension, CKD stage 4, T2DM, debility, generalized weakness, OA.  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 History obtained from review of EMR, discussion with primary team, family and/or patient. Records reviewed and summarized above. All 10 point systems reviewed and are negative except as documented in history of present illness above .  I spent 35 minutes providing this consultation; this includes time spent with patient/family, chart review and documentation. More than 50% of the time in this consultation was spent on counseling and coordinating communication   Thank you for the opportunity to participate in the care of Sonya Craig.  The palliative care team will continue to follow. Please call our office at [phone] if we can be of additional assistance.   Teodoro Spray, NP

## 2022-03-02 NOTE — Progress Notes (Signed)
Location:  Other [facility]) Nursing Home Room Number: [place] of Service:  SNF (31)  Kawon Willcutt K. Dewaine Oats, NP   Patient Care Team: Dewayne Shorter, MD as PCP - General [facility])  Extended Emergency Contact Information Primary Emergency Contact: Coble,Teresa Tapp Address: [address]          [place], [place] [address] Johnnette Litter of [place] Work Phone: [phone] Mobile Phone: [phone] Relation: Daughter Secondary Emergency Contact: [place] of [place] Phone: [phone] Relation: Daughter  Goals of care: Advanced Directive information    03/02/2022   12:04 PM  Advanced Directives  Does Patient Have a Medical Advance Directive? Yes  Type of Advance Directive Out of facility DNR (pink MOST or yellow form)  Does patient want to make changes to medical advance directive? No - Patient declined  Pre-existing out of facility DNR order (yellow form or pink MOST form) Pink MOST form placed in chart (order not valid for inpatient use)     Chief Complaint  Patient presents with   Medical Management of Chronic Issues    Routine visit. Discuss need for td/tdap, pneumonia vaccine, DEXA, shingrix and additional covid boosters or post pone if patient refuses or is not a candidate. Vitals and medications are a reflection of [facility]MR system, [facility]     Acute Visit    Ingrown toenail     HPI:  Pt is a 89 y.o. female seen today for medical management of chronic disease.  Pt with hx of DM, HTN, hyperlipidemia, CKD.  She is mostly bed bound by choice. Reports she will get out of bed to chair occasionally. Denies pain or discomfort in chair.  Nursing reports increase redness to left great toenail. Pt denies pain at this time.     Past Medical History:  Diagnosis Date   Delirium 04/27/2020   Diabetes mellitus without complication ([place])    Hypercholesterolemia    Hypertension    Stage 3 chronic kidney disease due to type  2 diabetes mellitus [facility])    Past Surgical History:  Procedure Laterality Date   COLONOSCOPY N/A 09/24/2021   Procedure: COLONOSCOPY/BOWEL DECOMPRESSION;  Surgeon: [name], MD;  Location: [facility] ENDOSCOPY;  Service: Endoscopy;  Laterality: N/A;    No Known Allergies  Outpatient Encounter Medications as of 03/02/2022  Medication Sig   acetaminophen (TYLENOL) 325 MG tablet Take 2 tablets (650 mg total) by mouth every 6 (six) hours as needed for mild pain (or Fever >/= 101).   Infant Care Products [facility]) OINT Apply to sacrum,coccyx,buttocks topically as needed for redness.   Lidocaine HCl (ASPERCREME LIDOCAINE) 4 % CREA Apply to knees topically every 24 hours as needed for pain.   lisinopril (ZESTRIL) 20 MG tablet Take 1 tablet (20 mg total) by mouth daily.   potassium chloride SA (KLOR-CON M) 20 MEQ tablet Take 20 mEq by mouth 2 (two) times daily.   simethicone (MYLICON) 80 MG chewable tablet Chew 80 mg by mouth every 6 (six) hours as needed for flatulence.   No facility-administered encounter medications on file as of 03/02/2022.    Review of Systems  Constitutional:  Negative for activity change, appetite change, fatigue and unexpected weight change.  HENT:  Negative for congestion and hearing loss.   Eyes: Negative.   Respiratory:  Negative for cough and shortness of breath.   Cardiovascular:  Negative for chest pain, palpitations and leg swelling.  Gastrointestinal:  Negative for abdominal pain, constipation and diarrhea.  Genitourinary:  Negative for difficulty urinating and dysuria.  Musculoskeletal:  Negative for arthralgias and myalgias.  Skin:  Negative for color change and wound.  Neurological:  Negative for dizziness and weakness.  Psychiatric/Behavioral:  Negative for agitation, behavioral problems and confusion.     Immunization History  Administered Date(s) Administered   Influenza Inj Mdck Quad Pf 12/14/2017   Influenza,inj,Quad PF,6+ Mos 11/07/2019    Influenza-Unspecified 12/06/2021   Unspecified SARS-COV-2 Vaccination 12/30/2021   Zoster Recombinat (Shingrix) 12/14/2017   Pertinent  Health Maintenance Due  Topic Date Due   DEXA SCAN  Never done   INFLUENZA VACCINE  Completed      09/25/2021   10:00 AM 09/25/2021    9:00 PM 09/26/2021    8:00 AM 09/26/2021    8:36 PM 09/27/2021    9:00 AM  Fall Risk  Patient Fall Risk Level High fall risk High fall risk High fall risk High fall risk High fall risk   Functional Status Survey:    Vitals:   03/02/22 1200  BP: 126/62  Pulse: 68  Temp: (!) 97.3 F (36.3 C)  Weight: 116 lb (52.6 kg)  Height: 5\' 3"  (1.6 m)   Wt Readings from Last 3 Encounters:  03/02/22 116 lb (52.6 kg)  01/30/22 116 lb (52.6 kg)  11/22/21 116 lb (52.6 kg)    Body mass index is 20.55 kg/m. Physical Exam Constitutional:      General: She is not in acute distress.    Appearance: She is well-developed. She is not diaphoretic.  HENT:     Head: Normocephalic and atraumatic.     Mouth/Throat:     Pharynx: No oropharyngeal exudate.  Eyes:     Conjunctiva/sclera: Conjunctivae normal.     Pupils: Pupils are equal, round, and reactive to light.  Cardiovascular:     Rate and Rhythm: Normal rate and regular rhythm.     Heart sounds: Normal heart sounds.  Pulmonary:     Effort: Pulmonary effort is normal.     Breath sounds: Normal breath sounds.  Abdominal:     General: Bowel sounds are normal.     Palpations: Abdomen is soft.  Musculoskeletal:     Cervical back: Normal range of motion and neck supple.     Right lower leg: No edema.     Left lower leg: No edema.  Feet:     Left foot:     Toenail Condition: Left toenails are ingrown.     Comments: Noted to have mild redness to medial left great toenail. With drainage. No tenderness on exam.  Skin:    General: Skin is warm and dry.  Neurological:     Mental Status: She is alert.  Psychiatric:        Mood and Affect: Mood normal.     Labs  reviewed: Recent Labs    09/24/21 1308 09/25/21 0825 09/25/21 1943 09/26/21 0538 09/27/21 0439 10/03/21 0000 11/24/21 0000 12/22/21 0000  NA 146* 142  --  134* 139 138 139 140  K 3.8 2.4*   < > 3.2* 3.2* 3.3* 4.6 3.9  CL 127* 123*  --  116* 119* 113* 115* 114*  CO2 12* 15*  --  14* 16* 19 19 20   GLUCOSE 123* 106*  --  148* 68*  --   --   --   BUN 43* 33*  --  26* 23 26* 48* 35*  CREATININE 1.83* 1.51*  --  1.45* 1.49* 1.3* 1.6* 1.7*  CALCIUM  8.7* 8.1*  --  7.7* 7.8* 8.3* 8.8 8.7  MG 2.2 1.9  --   --   --   --   --   --   PHOS  --  1.6*  --   --   --   --   --   --    < > = values in this interval not displayed.   Recent Labs    09/23/21 1421 10/03/21 0000 11/24/21 0000 12/22/21 0000  AST 24  --  13 15  ALT 11  --  6* 4*  ALKPHOS 52 3.7* 40 45  BILITOT 0.7  --   --   --   PROT 6.5  --   --   --   ALBUMIN 3.7 2.8* 3.3* 3.3*   Recent Labs    09/25/21 0825 09/26/21 0538 09/27/21 0439 12/22/21 0000  WBC 6.5 7.8 5.7 3.8  NEUTROABS  --   --   --  1,695.00  HGB 10.4* 10.9* 10.0* 10.1*  HCT 31.2* 32.6* 29.9* 31*  MCV 94.3 92.9 93.7  --   PLT 167 176 158 178   Lab Results  Component Value Date   TSH 0.956 04/17/2020   Lab Results  Component Value Date   HGBA1C 5.3 12/22/2021   Lab Results  Component Value Date   CHOL 147 12/22/2021   HDL 40 12/22/2021   LDLCALC 89 12/22/2021   TRIG 88 12/22/2021    Significant Diagnostic Results in last 30 days:  No results found.  Assessment/Plan 1. CKD (chronic kidney disease), stage IV (HCC) Encourage proper hydration Follow metabolic panel Avoid nephrotoxic meds (NSAIDS)  2. Ogilvie syndrome -stable, continues to tolerate regular diet and having regular BM.  4. Essential hypertension Blood pressure well controlled, goal bp <140/90 Continue current medications and dietary modifications follow metabolic panel  5. Anemia due to stage 4 chronic kidney disease (HCC) -stable, will continue to follow   6.  Hypokalemia Continues on potassium supplements, continue to monitor BMP.   7. Ingrown nail of great toe -will have staff do warm epsom salt soaks TID for 7 days and monitor. To notify for worsening of symptoms. She has been placed on podiatry list.    Carlos American. Nixon, [facility] [phone]

## 2022-03-03 NOTE — Addendum Note (Signed)
Addended by: Lauree Chandler on: 03/03/2022 10:05 AM   Modules accepted: Level of Service

## 2022-03-27 LAB — BASIC METABOLIC PANEL
BUN: 42 — AB (ref 4–21)
CO2: 18 (ref 13–22)
Chloride: 119 — AB (ref 99–108)
Creatinine: 1.7 — AB (ref 0.5–1.1)
Glucose: 79
Potassium: 4.6 mEq/L (ref 3.5–5.1)
Sodium: 145 (ref 137–147)

## 2022-03-27 LAB — CBC AND DIFFERENTIAL
HCT: 32 — AB (ref 36–46)
Hemoglobin: 10.4 — AB (ref 12.0–16.0)
Neutrophils Absolute: 1622
Platelets: 151 10*3/uL (ref 150–400)
WBC: 3.9

## 2022-03-27 LAB — COMPREHENSIVE METABOLIC PANEL
Calcium: 8.8 (ref 8.7–10.7)
eGFR: 28

## 2022-03-27 LAB — CBC: RBC: 3.3 — AB (ref 3.87–5.11)

## 2022-04-05 ENCOUNTER — Non-Acute Institutional Stay: Payer: 59 | Admitting: Hospice

## 2022-04-05 DIAGNOSIS — Z515 Encounter for palliative care: Secondary | ICD-10-CM

## 2022-04-05 DIAGNOSIS — R531 Weakness: Secondary | ICD-10-CM

## 2022-04-05 DIAGNOSIS — R5381 Other malaise: Secondary | ICD-10-CM

## 2022-04-05 DIAGNOSIS — E440 Moderate protein-calorie malnutrition: Secondary | ICD-10-CM

## 2022-04-05 NOTE — Progress Notes (Signed)
    [facility] Consult Note Telephone: [phone]  Fax: [phone]     PATIENT NAME: Sonya Craig [address] [place] [address]   [phone] (home)  DOB: 1928-01-23 MRN: [id number] PRIMARY CARE PROVIDER:    Dewayne Shorter, MD,  [place] [address] [phone]  REFERRING PROVIDER:   Dewayne Shorter, MD [place],  [place] [address] [phone]  RESPONSIBLE PARTY:    Contact Information     Name Relation Home Work [place] Tapp Daughter  [phone]   [name] Daughter [phone]     Littles,Laderitca Daughter   [phone]        I met face to face with patient in the facility. Palliative Care was asked to follow this patient by consultation request of  Dewayne Shorter, MD to address advance care planning and complex medical decision making. This is a follow up visit.  ASSESSMENT AND PLAN / RECOMMENDATIONS:   Advance Care Planning/Goals of Care: Goals include to maximize quality of life and symptom management.   CODE STATUS: DNR, Do Not Hospitalize.   Symptom Management/Plan: Protein calorie malnutrition-nursing reports patient continues to refuse weighing.  Education provided to patient on need.  Continue Boost, provide assistance during meals to ensure adequate oral intake. Current weight is 116 pounds about a year ago, Current Albumin is 3.3 12/22/21.  Monitor weight per facility protocol.  Routine CBC CMP. Hypertension: Managed with Lisinopril.  Weakness:  Nursing reports she refuses to get out of bed.  Patient affirmed and said it is her choice. Education  provided on need for activity/Out of bed daily. Staff will continue to assist with adl's. Turn and reposition for comfort; used Gelfoam to help redistribute weight and prevent further skin breakdown; float bilateral heels. Debility: secondary advance age, multiple morbidities of hypertension,  CKD 4, PCM, Ogilvie syndrome. Continue to provide ongoing supportive care.  Fall precautions.   Follow up Palliative Care Visit: Palliative care will continue to follow for complex medical decision making, advance care planning, and clarification of goals. Return in 8 weeks or prn.  PPS: 30%  HOSPICE ELIGIBILITY/DIAGNOSIS: TBD  Chief Complaint: Palliative medicine follow-up visit.  HISTORY OF PRESENT ILLNESS:  CAROLLYNN PENNYWELL is a 89 y.o. year old female with multiple morbidities requiring close monitoring/management with high risk of complications and morbidity:  protein calorie malnutrition, hypertension, CKD stage 4, T2DM, debility, generalized weakness, OA.  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  Patient reports she is happy, in no pain/discomfort.  FLACC 0. History obtained from review of EMR, discussion with primary team, family and/or patient. Records reviewed and summarized above. All 10 point systems reviewed and are negative except as documented in history of present illness above .  I spent 35 minutes providing this consultation; this includes time spent with patient/family, chart review and documentation. More than 50% of the time in this consultation was spent on counseling and coordinating communication   Thank you for the opportunity to participate in the care of Ms. Barradas.  The palliative care team will continue to follow. Please call our office at [phone] if we can be of additional assistance.   Teodoro Spray, NP

## 2022-05-09 ENCOUNTER — Non-Acute Institutional Stay (SKILLED_NURSING_FACILITY): Payer: Medicare Other | Admitting: Nurse Practitioner

## 2022-05-09 ENCOUNTER — Non-Acute Institutional Stay: Payer: 59 | Admitting: Hospice

## 2022-05-09 ENCOUNTER — Encounter: Payer: Self-pay | Admitting: Nurse Practitioner

## 2022-05-09 DIAGNOSIS — E43 Unspecified severe protein-calorie malnutrition: Secondary | ICD-10-CM | POA: Diagnosis not present

## 2022-05-09 DIAGNOSIS — I10 Essential (primary) hypertension: Secondary | ICD-10-CM | POA: Diagnosis not present

## 2022-05-09 DIAGNOSIS — K5981 Ogilvie syndrome: Secondary | ICD-10-CM

## 2022-05-09 DIAGNOSIS — D631 Anemia in chronic kidney disease: Secondary | ICD-10-CM

## 2022-05-09 DIAGNOSIS — Z515 Encounter for palliative care: Secondary | ICD-10-CM

## 2022-05-09 DIAGNOSIS — R5381 Other malaise: Secondary | ICD-10-CM

## 2022-05-09 DIAGNOSIS — R531 Weakness: Secondary | ICD-10-CM

## 2022-05-09 DIAGNOSIS — N184 Chronic kidney disease, stage 4 (severe): Secondary | ICD-10-CM

## 2022-05-09 NOTE — Progress Notes (Signed)
    [facility] Consult Note Telephone: [phone]  Fax: [phone]     PATIENT NAME: Sonya Craig [address] [place] [address]   [phone] (home)  DOB: [birth year] MRN: [id number] PRIMARY CARE PROVIDER:    Dewayne Shorter, MD,  [place] [address] [phone]  REFERRING PROVIDER:   Dewayne Shorter, MD [place],  [place] [address] [phone]  RESPONSIBLE PARTY:    Contact Information     Name Relation Home Work [place] Tapp Daughter  [phone]   [name] Daughter [phone]     Littles,Laderitca Daughter   [phone]        I met face to face with patient in the facility. Palliative Care was asked to follow this patient by consultation request of  Dewayne Shorter, MD to address advance care planning and complex medical decision making. This is a follow up visit.  ASSESSMENT AND PLAN / RECOMMENDATIONS:   Advance Care Planning/Goals of Care: Goals include to maximize quality of life and symptom management.   CODE STATUS: DNR, Do Not Hospitalize.   Symptom Management/Plan: Protein calorie malnutrition: Current weight is  103 Ibs 04/20/25 down from 116 pounds about a year ago, Albumin is 3.3 12/22/21. Continue Boost BID for nutritional supplementation. Provide assistance during meals to ensure adequate oral intake.  Monitor weight per facility protocol.  Routine CBC CMP. If abnormal weight loss continues, possibility to look into hospice as an option for supportive care.  Hypertension: Managed with Lisinopril.   Weakness:  Out of bed daily. Nursing reports she refuses to get out of bed.  Patient affirmed and said it is her choice. Education reiterated on need for activity/Out of bed daily. Turn and reposition for comfort; use Gelfoam to help redistribute weight and prevent further skin breakdown; float bilateral heels.  Fall  precautions  Debility: secondary advance age, multiple morbidities of hypertension, CKD 4, PCM, Ogilvie syndrome.  Continue to provide ongoing supportive care  Follow up Palliative Care Visit: Palliative care will continue to follow for complex medical decision making, advance care planning, and clarification of goals. Return in 8 weeks or prn.  PPS: 30%  HOSPICE ELIGIBILITY/DIAGNOSIS: TBD  Chief Complaint: Palliative medicine follow-up visit.  HISTORY OF PRESENT ILLNESS:  Sonya Craig is a 89 y.o. year old female with multiple morbidities requiring close monitoring/management with high risk of complications and morbidity:  protein calorie malnutrition, hypertension, CKD stage 4, T2DM, debility, generalized weakness, OA.  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  Patient denies pain/discomfort.  FLACC 0. History obtained from review of EMR, discussion with primary team, family and/or patient. Records reviewed and summarized above. All 10 point systems reviewed and are negative except as documented in history of present illness above .  I spent 35 minutes providing this consultation; this includes time spent with patient/family, chart review and documentation. More than 50% of the time in this consultation was spent on counseling and coordinating communication   Thank you for the opportunity to participate in the care of Ms. Marsicano.  The palliative care team will continue to follow. Please call our office at [phone] if we can be of additional assistance.   Teodoro Spray, NP

## 2022-05-09 NOTE — Progress Notes (Signed)
Location:  Other [facility].  Nursing Home Room Number: [place]:  SNF [phone]) Provider:  Dani Gobble  PCP: Dewayne Shorter, MD  Patient Care Team: Dewayne Shorter, MD as PCP - [facility])  Extended Emergency Contact Information Primary Emergency Contact: Derrek Monaco Address: [address]          [place], [place] [address] Johnnette Litter of [place] Work Phone: [phone] Mobile Phone: [phone] Relation: Daughter Secondary Emergency Contact: [place] of [place] Phone: [phone] Relation: Daughter  Code Status:  DNR Goals of care: Advanced Directive information    05/09/2022    3:08 PM  Advanced Directives  Does Patient Have a Medical Advance Directive? Yes  Type of Advance Directive Out of facility DNR (pink MOST or yellow form)  Does patient want to make changes to medical advance directive? No - Patient declined     Chief Complaint  Patient presents with   Medical Management of Chronic Issues    Medical Management of Chronic Issues.     HPI:  Pt is a 89 y.o. female seen today for medical management of chronic diseases.  Pt with hx of DM, htn, CKD, hyperlipidemia.  She reports she is doing well.  States she does not have pain anywhere unless she tires to get up therefore prefers to stay in the bed. Staff and family encourages her to get out of bed.  She also will decline getting weights but last weight was stable Staff reports she eats all of her breakfast and continues on supplements.    Past Medical History:  Diagnosis Date   Delirium 04/27/2020   Diabetes mellitus without complication ([place])    Hypercholesterolemia    Hypertension    Stage 3 chronic kidney disease due to type 2 diabetes mellitus [facility])    Past Surgical History:  Procedure Laterality Date   COLONOSCOPY N/A 09/24/2021   Procedure: COLONOSCOPY/BOWEL DECOMPRESSION;  Surgeon: [name], MD;  Location: [facility]  ENDOSCOPY;  Service: Endoscopy;  Laterality: N/A;    No Known Allergies  Outpatient Encounter Medications as of 05/09/2022  Medication Sig   acetaminophen (TYLENOL) 325 MG tablet Take 2 tablets (650 mg total) by mouth every 6 (six) hours as needed for mild pain (or Fever >/= 101).   Infant Care Products [facility]) OINT Apply to sacrum,coccyx,buttocks topically as needed for redness.   lactose free nutrition (BOOST) LIQD Take 237 mLs by mouth 2 (two) times daily between meals.   lisinopril (ZESTRIL) 20 MG tablet Take 1 tablet (20 mg total) by mouth daily.   potassium chloride SA (KLOR-CON M) 20 MEQ tablet Take 20 mEq by mouth 2 (two) times daily.   simethicone (MYLICON) 80 MG chewable tablet Chew 80 mg by mouth every 6 (six) hours as needed for flatulence.   [DISCONTINUED] Lidocaine HCl (ASPERCREME LIDOCAINE) 4 % CREA Apply to knees topically every 24 hours as needed for pain.   No facility-administered encounter medications on file as of 05/09/2022.    Review of Systems  Constitutional:  Negative for activity change, appetite change, fatigue and unexpected weight change.  HENT:  Negative for congestion and hearing loss.   Eyes: Negative.   Respiratory:  Negative for cough and shortness of breath.   Cardiovascular:  Negative for chest pain, palpitations and leg swelling.  Gastrointestinal:  Negative for abdominal pain, constipation and diarrhea.  Genitourinary:  Negative for difficulty urinating and dysuria.  Musculoskeletal:  Negative for arthralgias and myalgias.  Skin:  Negative for color change and wound.  Neurological:  Negative for dizziness and weakness.  Psychiatric/Behavioral:  Negative for agitation, behavioral problems and confusion.     Immunization History  Administered Date(s) Administered   Influenza Inj Mdck Quad Pf 12/14/2017   Influenza,inj,Quad PF,6+ Mos 11/07/2019   Influenza-Unspecified 12/06/2021   Unspecified SARS-COV-2 Vaccination 11/12/2020, 07/19/2021,  07/19/2021, 12/30/2021   Zoster Recombinat (Shingrix) 12/14/2017   Pertinent  Health Maintenance Due  Topic Date Due   DEXA SCAN  Never done   INFLUENZA VACCINE  Completed      09/25/2021   10:00 AM 09/25/2021    9:00 PM 09/26/2021    8:00 AM 09/26/2021    8:36 PM 09/27/2021    9:00 AM  Fall Risk  (RETIRED) Patient Fall Risk Level High fall risk High fall risk High fall risk High fall risk High fall risk   Functional Status Survey:    Vitals:   05/09/22 1502  BP: (!) 123/59  Pulse: 67  Resp: 20  Temp: (!) 97.5 F (36.4 C)  SpO2: 99%  Weight: 103 lb (46.7 kg)  Height: 5\' 3"  (1.6 m)   Body mass index is 18.25 kg/m. Physical Exam Constitutional:      General: She is not in acute distress.    Appearance: She is well-developed. She is not diaphoretic.  HENT:     Head: Normocephalic and atraumatic.     Mouth/Throat:     Pharynx: No oropharyngeal exudate.  Eyes:     Conjunctiva/sclera: Conjunctivae normal.     Pupils: Pupils are equal, round, and reactive to light.  Cardiovascular:     Rate and Rhythm: Normal rate and regular rhythm.     Heart sounds: Normal heart sounds.  Pulmonary:     Effort: Pulmonary effort is normal.     Breath sounds: Normal breath sounds.  Abdominal:     General: Bowel sounds are normal.     Palpations: Abdomen is soft.  Musculoskeletal:     Cervical back: Normal range of motion and neck supple.     Right lower leg: Edema present.     Left lower leg: Edema present.  Skin:    General: Skin is warm and dry.  Neurological:     Mental Status: She is alert. Mental status is at baseline.     Motor: Weakness present.     Gait: Gait abnormal.  Psychiatric:        Mood and Affect: Mood normal.     Labs reviewed: Recent Labs    09/24/21 1308 09/25/21 0825 09/25/21 1943 09/26/21 0538 09/27/21 0439 10/03/21 0000 11/24/21 0000 12/22/21 0000 03/27/22 0000  NA 146* 142  --  134* 139   < > 139 140 145  K 3.8 2.4*   < > 3.2* 3.2*   < > 4.6 3.9  4.6  CL 127* 123*  --  116* 119*   < > 115* 114* 119*  CO2 12* 15*  --  14* 16*   < > 19 20 18   GLUCOSE 123* 106*  --  148* 68*  --   --   --   --   BUN 43* 33*  --  26* 23   < > 48* 35* 42*  CREATININE 1.83* 1.51*  --  1.45* 1.49*   < > 1.6* 1.7* 1.7*  CALCIUM 8.7* 8.1*  --  7.7* 7.8*   < > 8.8 8.7 8.8  MG 2.2 1.9  --   --   --   --   --   --   --  PHOS  --  1.6*  --   --   --   --   --   --   --    < > = values in this interval not displayed.   Recent Labs    09/23/21 1421 10/03/21 0000 11/24/21 0000 12/22/21 0000  AST 24  --  13 15  ALT 11  --  6* 4*  ALKPHOS 52 3.7* 40 45  BILITOT 0.7  --   --   --   PROT 6.5  --   --   --   ALBUMIN 3.7 2.8* 3.3* 3.3*   Recent Labs    09/25/21 0825 09/26/21 0538 09/27/21 0439 12/22/21 0000 03/27/22 0000  WBC 6.5 7.8 5.7 3.8 3.9  NEUTROABS  --   --   --  1,695.00 1,622.00  HGB 10.4* 10.9* 10.0* 10.1* 10.4*  HCT 31.2* 32.6* 29.9* 31* 32*  MCV 94.3 92.9 93.7  --   --   PLT 167 176 158 178 151   Lab Results  Component Value Date   TSH 0.956 04/17/2020   Lab Results  Component Value Date   HGBA1C 5.3 12/22/2021   Lab Results  Component Value Date   CHOL 147 12/22/2021   HDL 40 12/22/2021   LDLCALC 89 12/22/2021   TRIG 88 12/22/2021    Significant Diagnostic Results in last 30 days:  No results found.  Assessment/Plan 1. Essential hypertension Blood pressure well controlled, goal bp <140/90 Continue current medications and dietary modifications follow metabolic panel  2. Anemia due to stage 4 chronic kidney disease (HCC) -stable, will continue to follow.   3. CKD (chronic kidney disease), stage IV (HCC) -Chronic and stable Encourage proper hydration Follow metabolic panel Avoid nephrotoxic meds (NSAIDS)  4. Ogilvie syndrome -currently stable, no symptoms at this time with routine BM  5. Protein-calorie malnutrition, severe -continues supplements. Weight has been stable   Ellamae Lybeck K. [place], [facility] [phone]

## 2022-05-16 ENCOUNTER — Non-Acute Institutional Stay (INDEPENDENT_AMBULATORY_CARE_PROVIDER_SITE_OTHER): Payer: Medicare Other | Admitting: Nurse Practitioner

## 2022-05-16 ENCOUNTER — Encounter: Payer: Self-pay | Admitting: Nurse Practitioner

## 2022-05-16 DIAGNOSIS — Z0000 Encounter for general adult medical examination without abnormal findings: Secondary | ICD-10-CM | POA: Diagnosis not present

## 2022-05-16 NOTE — Patient Instructions (Signed)
Sonya Craig , Thank you for taking time to come for your Medicare Wellness Visit. I appreciate your ongoing commitment to your health goals. Please review the following plan we discussed and let me know if I can assist you in the future.   Screening recommendations/referrals: Colonoscopy aged out Mammogram aged out Bone Density NA Recommended yearly ophthalmology/optometry visit for glaucoma screening and checkup Recommended yearly dental visit for hygiene and checkup  Vaccinations: Influenza vaccine- due annually in September/October Pneumococcal vaccine up to date Tdap vaccine DUE Shingles vaccine she has had 1 of 2, due for 2nd    Advanced directives: on file  Conditions/risks identified: advanced age, decrease in mobility/sedentary lifestyle  Next appointment: yearly    Preventive Care 23 Years and Older, Female Preventive care refers to lifestyle choices and visits with your health care provider that can promote health and wellness. What does preventive care include? A yearly physical exam. This is also called an annual well check. Dental exams once or twice a year. Routine eye exams. Ask your health care provider how often you should have your eyes checked. Personal lifestyle choices, including: Daily care of your teeth and gums. Regular physical activity. Eating a healthy diet. Avoiding tobacco and drug use. Limiting alcohol use. Practicing safe sex. Taking low-dose aspirin every day. Taking vitamin and mineral supplements as recommended by your health care provider. What happens during an annual well check? The services and screenings done by your health care provider during your annual well check will depend on your age, overall health, lifestyle risk factors, and family history of disease. Counseling  Your health care provider may ask you questions about your: Alcohol use. Tobacco use. Drug use. Emotional well-being. Home and relationship well-being. Sexual  activity. Eating habits. History of falls. Memory and ability to understand (cognition). Work and work Statistician. Reproductive health. Screening  You may have the following tests or measurements: Height, weight, and BMI. Blood pressure. Lipid and cholesterol levels. These may be checked every 5 years, or more frequently if you are over 63 years old. Skin check. Lung cancer screening. You may have this screening every year starting at age 38 if you have a 30-pack-year history of smoking and currently smoke or have quit within the past 15 years. Fecal occult blood test (FOBT) of the stool. You may have this test every year starting at age 58. Flexible sigmoidoscopy or colonoscopy. You may have a sigmoidoscopy every 5 years or a colonoscopy every 10 years starting at age 75. Hepatitis C blood test. Hepatitis B blood test. Sexually transmitted disease (STD) testing. Diabetes screening. This is done by checking your blood sugar (glucose) after you have not eaten for a while (fasting). You may have this done every 1-3 years. Bone density scan. This is done to screen for osteoporosis. You may have this done starting at age 60. Mammogram. This may be done every 1-2 years. Talk to your health care provider about how often you should have regular mammograms. Talk with your health care provider about your test results, treatment options, and if necessary, the need for more tests. Vaccines  Your health care provider may recommend certain vaccines, such as: Influenza vaccine. This is recommended every year. Tetanus, diphtheria, and acellular pertussis (Tdap, Td) vaccine. You may need a Td booster every 10 years. Zoster vaccine. You may need this after age 59. Pneumococcal 13-valent conjugate (PCV13) vaccine. One dose is recommended after age 21. Pneumococcal polysaccharide (PPSV23) vaccine. One dose is recommended after age 43. Talk  to your health care provider about which screenings and vaccines  you need and how often you need them. This information is not intended to replace advice given to you by your health care provider. Make sure you discuss any questions you have with your health care provider. Document Released: 03/05/2015 Document Revised: 10/27/2015 Document Reviewed: 12/08/2014 Elsevier Interactive Patient Education  2017  Prevention in the Home Falls can cause injuries. They can happen to people of all ages. There are many things you can do to make your home safe and to help prevent falls. What can I do on the outside of my home? Regularly fix the edges of walkways and driveways and fix any cracks. Remove anything that might make you trip as you walk through a door, such as a raised step or threshold. Trim any bushes or trees on the path to your home. Use bright outdoor lighting. Clear any walking paths of anything that might make someone trip, such as rocks or tools. Regularly check to see if handrails are loose or broken. Make sure that both sides of any steps have handrails. Any raised decks and porches should have guardrails on the edges. Have any leaves, snow, or ice cleared regularly. Use sand or salt on walking paths during winter. Clean up any spills in your garage right away. This includes oil or grease spills. What can I do in the bathroom? Use night lights. Install grab bars by the toilet and in the tub and shower. Do not use towel bars as grab bars. Use non-skid mats or decals in the tub or shower. If you need to sit down in the shower, use a plastic, non-slip stool. Keep the floor dry. Clean up any water that spills on the floor as soon as it happens. Remove soap buildup in the tub or shower regularly. Attach bath mats securely with double-sided non-slip rug tape. Do not have throw rugs and other things on the floor that can make you trip. What can I do in the bedroom? Use night lights. Make sure that you have a light by your bed that  is easy to reach. Do not use any sheets or blankets that are too big for your bed. They should not hang down onto the floor. Have a firm chair that has side arms. You can use this for support while you get dressed. Do not have throw rugs and other things on the floor that can make you trip. What can I do in the kitchen? Clean up any spills right away. Avoid walking on wet floors. Keep items that you use a lot in easy-to-reach places. If you need to reach something above you, use a strong step stool that has a grab bar. Keep electrical cords out of the way. Do not use floor polish or wax that makes floors slippery. If you must use wax, use non-skid floor wax. Do not have throw rugs and other things on the floor that can make you trip. What can I do with my stairs? Do not leave any items on the stairs. Make sure that there are handrails on both sides of the stairs and use them. Fix handrails that are broken or loose. Make sure that handrails are as long as the stairways. Check any carpeting to make sure that it is firmly attached to the stairs. Fix any carpet that is loose or worn. Avoid having throw rugs at the top or bottom of the stairs. If you do have throw rugs,  attach them to the floor with carpet tape. Make sure that you have a light switch at the top of the stairs and the bottom of the stairs. If you do not have them, ask someone to add them for you. What else can I do to help prevent falls? Wear shoes that: Do not have high heels. Have rubber bottoms. Are comfortable and fit you well. Are closed at the toe. Do not wear sandals. If you use a stepladder: Make sure that it is fully opened. Do not climb a closed stepladder. Make sure that both sides of the stepladder are locked into place. Ask someone to hold it for you, if possible. Clearly mark and make sure that you can see: Any grab bars or handrails. First and last steps. Where the edge of each step is. Use tools that help you  move around (mobility aids) if they are needed. These include: Canes. Walkers. Scooters. Crutches. Turn on the lights when you go into a dark area. Replace any light bulbs as soon as they burn out. Set up your furniture so you have a clear path. Avoid moving your furniture around. If any of your floors are uneven, fix them. If there are any pets around you, be aware of where they are. Review your medicines with your doctor. Some medicines can make you feel dizzy. This can increase your chance of falling. Ask your doctor what other things that you can do to help prevent falls. This information is not intended to replace advice given to you by your health care provider. Make sure you discuss any questions you have with your health care provider. Document Released: 12/03/2008 Document Revised: 07/15/2015 Document Reviewed: 03/13/2014 Elsevier Interactive Patient Education  2017 [employer].

## 2022-05-16 NOTE — Progress Notes (Signed)
Subjective:   Sonya Craig is a 89 y.o. female who presents for Medicare Annual (Subsequent) preventive examination.  Review of Systems     Cardiac Risk Factors include: advanced age (>24men, >72 women);sedentary lifestyle;hypertension     Objective:    Today's Vitals   05/16/22 1415  BP: (!) 129/59  Pulse: 68  Resp: 17  Temp: 97.9 F (36.6 C)  SpO2: 100%  Weight: 103 lb (46.7 kg)  Height: 5\' 3"  (1.6 m)   Body mass index is 18.25 kg/m.     05/16/2022    2:18 PM 05/09/2022    3:08 PM 03/02/2022   12:04 PM 01/30/2022   10:17 AM 11/22/2021    2:31 PM 09/24/2021   11:07 AM 09/23/2021    8:00 PM  Advanced Directives  Does Patient Have a Medical Advance Directive? Yes Yes Yes Yes Yes Yes Yes  Type of Advance Directive Out of facility DNR (pink MOST or yellow form) Out of facility DNR (pink MOST or yellow form) Out of facility DNR (pink MOST or yellow form) Out of facility DNR (pink MOST or yellow form) Out of facility DNR (pink MOST or yellow form)  Out of facility DNR (pink MOST or yellow form)  Does patient want to make changes to medical advance directive? No - Guardian declined No - Patient declined No - Patient declined No - Patient declined No - Patient declined  No - Guardian declined  Pre-existing out of facility DNR order (yellow form or pink MOST form) Pink MOST form placed in chart (order not valid for inpatient use)  Pink MOST form placed in chart (order not valid for inpatient use)  Pink MOST form placed in chart (order not valid for inpatient use)  Pink MOST/Yellow Form most recent copy in chart - Physician notified to receive inpatient order    Current Medications (verified) Outpatient Encounter Medications as of 05/16/2022  Medication Sig   acetaminophen (TYLENOL) 325 MG tablet Take 2 tablets (650 mg total) by mouth every 6 (six) hours as needed for mild pain (or Fever >/= 101).   Infant Care Products [facility]) OINT Apply to sacrum,coccyx,buttocks topically as  needed for redness.   lisinopril (ZESTRIL) 20 MG tablet Take 1 tablet (20 mg total) by mouth daily.   potassium chloride SA (KLOR-CON M) 20 MEQ tablet Take 20 mEq by mouth 2 (two) times daily.   simethicone (MYLICON) 80 MG chewable tablet Chew 80 mg by mouth every 6 (six) hours as needed for flatulence.   [DISCONTINUED] lactose free nutrition (BOOST) LIQD Take 237 mLs by mouth 2 (two) times daily between meals.   No facility-administered encounter medications on file as of 05/16/2022.    Allergies (verified) Patient has no known allergies.   History: Past Medical History:  Diagnosis Date   Delirium 04/27/2020   Diabetes mellitus without complication ([place])    Hypercholesterolemia    Hypertension    Stage 3 chronic kidney disease due to type 2 diabetes mellitus [facility])    Past Surgical History:  Procedure Laterality Date   COLONOSCOPY N/A 09/24/2021   Procedure: COLONOSCOPY/BOWEL DECOMPRESSION;  Surgeon: [name], MD;  Location: [facility] ENDOSCOPY;  Service: Endoscopy;  Laterality: N/A;   History reviewed. No pertinent family history. Social History   Socioeconomic History   Marital status: Divorced    Spouse name: Not on file   Number of children: Not on file   Years of education: Not on file   Highest education level: Not on file  Occupational History   Not on file  Tobacco Use   Smoking status: Never   Smokeless tobacco: Never  Vaping Use   Vaping Use: Never used  Substance and Sexual Activity   Alcohol use: Yes    Comment: occasionally   Drug use: Never   Sexual activity: Not on file  Other Topics Concern   Not on file  Social History Narrative   Not on file   Social Determinants of Health   Financial Resource Strain: Not on file  Food Insecurity: Not on file  Transportation Needs: Not on file  Physical Activity: Not on file  Stress: Not on file  Social Connections: Not on file    Tobacco Counseling Counseling given: Not Answered   Clinical  Intake:  Pre-visit preparation completed: Yes  Pain : No/denies pain     BMI - recorded: 18.25 Nutritional Status: BMI <19  Underweight Nutritional Risks: Unintentional weight loss  How often do you need to have someone help you when you read instructions, pamphlets, or other written materials from your doctor or pharmacy?: 3 - Sometimes  Diabetic?no         Activities of Daily Living    05/16/2022    2:23 PM 09/23/2021    8:00 PM  In your present state of health, do you have any difficulty performing the following activities:  Hearing? 0 0  Vision? 0 0  Difficulty concentrating or making decisions?  0  Walking or climbing stairs? 1 1  Dressing or bathing? 1 1  Doing errands, shopping? 1 1  Preparing Food and eating ? Y   Using the Toilet? Y   In the past six months, have you accidently leaked urine? Y   Do you have problems with loss of bowel control? Y   Managing your Medications? Y   Managing your Finances? Y   Housekeeping or managing your Housekeeping? Y     Patient Care Team: Dewayne Shorter, MD as PCP - General (Family Medicine)  Indicate any recent Medical Services you may have received from other than [facility] providers in the past year (date may be approximate).     Assessment:   This is a routine wellness examination for Sonya Craig.  Hearing/Vision screen No results found.  Dietary issues and exercise activities discussed: Current Exercise Habits: The patient does not participate in regular exercise at present   Goals Addressed   None    Depression Screen     No data to display          Fall Risk     No data to display          FALL RISK PREVENTION PERTAINING TO THE HOME:  Any stairs in or around the home? No  If so, are there any without handrails?  na Home free of loose throw rugs in walkways, pet beds, electrical cords, etc? Yes  Adequate lighting in your home to reduce risk of falls? Yes   ASSISTIVE DEVICES UTILIZED TO PREVENT  FALLS:  Life alert? No  Use of a cane, walker or w/c? Yes  Grab bars in the bathroom? Yes  Shower chair or bench in shower? Yes  Elevated toilet seat or a handicapped toilet? Yes   TIMED UP AND GO:  Was the test performed? No .    Cognitive Function:        Immunizations Immunization History  Administered Date(s) Administered   Influenza Inj Mdck Quad Pf 12/14/2017   Influenza,inj,Quad PF,6+ Mos 11/07/2019  Influenza-Unspecified 12/06/2021   Moderna SARS-COV2 Booster Vaccination 02/10/2020, 07/09/2020   Moderna Sars-Covid-2 Vaccination 04/19/2019, 05/17/2019   PFIZER Comirnaty(Gray Top)Covid-19 Tri-Sucrose Vaccine 12/30/2021   Pneumococcal Conjugate-13 11/21/2015   Pneumococcal Polysaccharide-23 04/20/2005   Unspecified SARS-COV-2 Vaccination 11/12/2020, 07/19/2021   Zoster Recombinat (Shingrix) 12/14/2017    TDAP status: Due, Education has been provided regarding the importance of this vaccine. Advised may receive this vaccine at local pharmacy or Health Dept. Aware to provide a copy of the vaccination record if obtained from local pharmacy or Health Dept. Verbalized acceptance and understanding.  Flu Vaccine status: Up to date  Pneumococcal vaccine status: Up to date  Covid-19 vaccine status: Information provided on how to obtain vaccines.   Qualifies for Shingles Vaccine? Yes   Zostavax completed Yes   Shingrix Completed?: Yes  Screening Tests Health Maintenance  Topic Date Due   DTaP/Tdap/Td (1 - Tdap) Never done   DEXA SCAN  Never done   Zoster Vaccines- Shingrix (2 of 2) 02/08/2018   COVID-19 Vaccine (8 - 2023-24 season) 02/24/2022   Pneumonia Vaccine 36+ Years old  Completed   INFLUENZA VACCINE  Completed   HPV VACCINES  Aged Out    Health Maintenance  Health Maintenance Due  Topic Date Due   DTaP/Tdap/Td (1 - Tdap) Never done   DEXA SCAN  Never done   Zoster Vaccines- Shingrix (2 of 2) 02/08/2018   COVID-19 Vaccine (8 - 2023-24 season)  02/24/2022    Colorectal cancer screening: No longer required.   Mammogram status: No longer required due to age.  Bed bound-   Lung Cancer Screening: (Low Dose CT Chest recommended if Age 12-80 years, 30 pack-year currently smoking OR have quit w/in 15years.) does not qualify.    Additional Screening:  Hepatitis C Screening: does not qualify;   Vision Screening: Recommended annual ophthalmology exams for early detection of glaucoma and other disorders of the eye. Is the patient up to date with their annual eye exam?  Yes  Who is the provider or what is the name of the office in which the patient attends annual eye exams? In house ophthalmology If pt is not established with a provider, would they like to be referred to a provider to establish care? No .   Dental Screening: Recommended annual dental exams for proper oral hygiene  Community Resource Referral / Chronic Care Management: CRR required this visit?  No   CCM required this visit?  No      Plan:     I have personally reviewed and noted the following in the patient's chart:   Medical and social history Use of alcohol, tobacco or illicit drugs  Current medications and supplements including opioid prescriptions. Patient is not currently taking opioid prescriptions. Functional ability and status Nutritional status Physical activity Advanced directives List of other physicians Hospitalizations, surgeries, and ER visits in previous 12 months Vitals Screenings to include cognitive, depression, and falls Referrals and appointments  In addition, I have reviewed and discussed with patient certain preventive protocols, quality metrics, and best practice recommendations. A written personalized care plan for preventive services as well as general preventive health recommendations were provided to patient.     Lauree Chandler, NP   05/16/2022   Place of service: [facility]

## 2022-06-21 LAB — HEMOGLOBIN A1C: Hemoglobin A1C: 5.2

## 2022-07-05 ENCOUNTER — Encounter: Payer: Self-pay | Admitting: Student

## 2022-07-05 ENCOUNTER — Non-Acute Institutional Stay (SKILLED_NURSING_FACILITY): Payer: Medicare Other | Admitting: Student

## 2022-07-05 DIAGNOSIS — K5981 Ogilvie syndrome: Secondary | ICD-10-CM

## 2022-07-05 DIAGNOSIS — E43 Unspecified severe protein-calorie malnutrition: Secondary | ICD-10-CM

## 2022-07-05 DIAGNOSIS — N184 Chronic kidney disease, stage 4 (severe): Secondary | ICD-10-CM

## 2022-07-05 DIAGNOSIS — R5381 Other malaise: Secondary | ICD-10-CM

## 2022-07-05 DIAGNOSIS — I779 Disorder of arteries and arterioles, unspecified: Secondary | ICD-10-CM

## 2022-07-05 DIAGNOSIS — I10 Essential (primary) hypertension: Secondary | ICD-10-CM | POA: Diagnosis not present

## 2022-07-05 NOTE — Progress Notes (Signed)
Location:  Other [place].  Nursing Home Room Number: [facility] 302A Place of Service:  SNF [phone]) Provider:  Earnestine Mealing, MD  Patient Care Team: Earnestine Mealing, MD as PCP - General [facility] Medicine)  Extended Emergency Contact Information Primary Emergency Contact: Coble,Teresa Tapp Address: [address]          [place], [place] [address] [place] of [place] Work Phone: [phone] Mobile Phone: [phone] Relation: Daughter Secondary Emergency Contact: [name] Home Phone: [phone] Relation: Daughter  Code Status:  DNR Goals of care: Advanced Directive information    07/05/2022   10:04 AM  Advanced Directives  Does Patient Have a Medical Advance Directive? Yes  Type of Advance Directive Out of facility DNR (pink MOST or yellow form)  Does patient want to make changes to medical advance directive? No - Patient declined     Chief Complaint  Patient presents with   Medical Management of Chronic Issues    Medical Management of Chronic Issues.     HPI:  Pt is a 89 y.o. female seen today for medical management of chronic diseases.    Patient is alert and disoriented. She continues to say she "just got back from the hospital" but that was >1 year ago.   Nursing without concerns at this time.   Past Medical History:  Diagnosis Date   Delirium 04/27/2020   Diabetes mellitus without complication (HCC)    Hypercholesterolemia    Hypertension    Stage 3 chronic kidney disease due to type 2 diabetes mellitus [facility])    Past Surgical History:  Procedure Laterality Date   COLONOSCOPY N/A 09/24/2021   Procedure: COLONOSCOPY/BOWEL DECOMPRESSION;  Surgeon: Midge Minium, MD;  Location: [facility] ENDOSCOPY;  Service: Endoscopy;  Laterality: N/A;    No Known Allergies  Outpatient Encounter Medications as of 07/05/2022  Medication Sig   acetaminophen (TYLENOL) 325 MG tablet Take 2 tablets (650 mg total) by mouth every 6 (six) hours  as needed for mild pain (or Fever >/= 101).   Infant Care Products [facility]) OINT Apply to sacrum,coccyx,buttocks topically as needed for redness.   simethicone (MYLICON) 80 MG chewable tablet Chew 80 mg by mouth every 6 (six) hours as needed for flatulence.   [DISCONTINUED] lisinopril (ZESTRIL) 20 MG tablet Take 1 tablet (20 mg total) by mouth daily.   [DISCONTINUED] potassium chloride SA (KLOR-CON M) 20 MEQ tablet Take 20 mEq by mouth 2 (two) times daily.   No facility-administered encounter medications on file as of 07/05/2022.    Review of Systems  Immunization History  Administered Date(s) Administered   Influenza Inj Mdck Quad Pf 12/14/2017   Influenza,inj,Quad PF,6+ Mos 11/07/2019   Influenza-Unspecified 12/06/2021   Moderna SARS-COV2 Booster Vaccination 02/10/2020, 07/09/2020   Moderna Sars-Covid-2 Vaccination 04/19/2019, 05/17/2019   PFIZER Comirnaty(Gray Top)Covid-19 Tri-Sucrose Vaccine 12/30/2021   Pneumococcal Conjugate-13 11/21/2015   Pneumococcal Polysaccharide-23 04/20/2005   Unspecified SARS-COV-2 Vaccination 11/12/2020, 07/19/2021   Zoster Recombinat (Shingrix) 12/14/2017   Pertinent  Health Maintenance Due  Topic Date Due   DEXA SCAN  Never done   INFLUENZA VACCINE  09/21/2022      09/25/2021   10:00 AM 09/25/2021    9:00 PM 09/26/2021    8:00 AM 09/26/2021    8:36 PM 09/27/2021    9:00 AM  Fall Risk  (RETIRED) Patient Fall Risk Level High fall risk High fall risk High fall risk High fall risk High fall risk   Functional Status Survey:  Vitals:   07/05/22 0944 07/05/22 1002  BP: (!) 100/56 (!) 103/50  Pulse: 90   Resp: 18   Temp: 97.9 F (36.6 C)   SpO2: 100%   Weight: 111 lb 6.4 oz (50.5 kg)   Height: 5\' 3"  (1.6 m)    Body mass index is 19.73 kg/m. Physical Exam Constitutional:      Comments: Laying in bed, thin, with 6 covers on top of her covering her head and face. Room temperature elevated to 78  Cardiovascular:     Rate and Rhythm: Normal  rate.     Pulses: Normal pulses.  Pulmonary:     Effort: Pulmonary effort is normal.  Abdominal:     Palpations: Abdomen is soft.  Musculoskeletal:        General: No swelling.  Skin:    General: Skin is warm and dry.  Neurological:     Mental Status: She is alert. Mental status is at baseline. She is disoriented.     Labs reviewed: Recent Labs    09/24/21 1308 09/25/21 0825 09/25/21 1943 09/26/21 0538 09/27/21 0439 10/03/21 0000 11/24/21 0000 12/22/21 0000 03/27/22 0000  NA 146* 142  --  134* 139   < > 139 140 145  K 3.8 2.4*   < > 3.2* 3.2*   < > 4.6 3.9 4.6  CL 127* 123*  --  116* 119*   < > 115* 114* 119*  CO2 12* 15*  --  14* 16*   < > 19 20 18   GLUCOSE 123* 106*  --  148* 68*  --   --   --   --   BUN 43* 33*  --  26* 23   < > 48* 35* 42*  CREATININE 1.83* 1.51*  --  1.45* 1.49*   < > 1.6* 1.7* 1.7*  CALCIUM 8.7* 8.1*  --  7.7* 7.8*   < > 8.8 8.7 8.8  MG 2.2 1.9  --   --   --   --   --   --   --   PHOS  --  1.6*  --   --   --   --   --   --   --    < > = values in this interval not displayed.   Recent Labs    09/23/21 1421 10/03/21 0000 11/24/21 0000 12/22/21 0000  AST 24  --  13 15  ALT 11  --  6* 4*  ALKPHOS 52 3.7* 40 45  BILITOT 0.7  --   --   --   PROT 6.5  --   --   --   ALBUMIN 3.7 2.8* 3.3* 3.3*   Recent Labs    09/25/21 0825 09/26/21 0538 09/27/21 0439 12/22/21 0000 03/27/22 0000  WBC 6.5 7.8 5.7 3.8 3.9  NEUTROABS  --   --   --  1,695.00 1,622.00  HGB 10.4* 10.9* 10.0* 10.1* 10.4*  HCT 31.2* 32.6* 29.9* 31* 32*  MCV 94.3 92.9 93.7  --   --   PLT 167 176 158 178 151   Lab Results  Component Value Date   TSH 0.956 04/17/2020   Lab Results  Component Value Date   HGBA1C 5.2 06/21/2022   Lab Results  Component Value Date   CHOL 147 12/22/2021   HDL 40 12/22/2021   LDLCALC 89 12/22/2021   TRIG 88 12/22/2021    Significant Diagnostic Results in last 30 days:  No results found.  Assessment/Plan  Protein-calorie malnutrition,  severe  Carotid artery disease, unspecified laterality, unspecified type (HCC), Chronic  Essential hypertension  Debility  CKD (chronic kidney disease), stage IV (HCC)  Ogilvie syndrome BP low without indication for potassium supplementation. Discontinue BP meds and daily BP for 1 week. Due to debility, patient is long term within facility. CKD IV patient and family are not interested in HD, will continue management conservatively. Hx of CAD, no ASA or lipid medications per previous discussions for GOC. Patient is on palliative care. Regular normal Bms.    Family/ staff Communication: nursing  Labs/tests ordered:  quarterly BMP, CBC

## 2022-07-20 LAB — BASIC METABOLIC PANEL
BUN: 43 — AB (ref 4–21)
CO2: 14 (ref 13–22)
Chloride: 115 — AB (ref 99–108)
Creatinine: 2.3 — AB (ref 0.5–1.1)
Glucose: 80
Potassium: 2.6 mEq/L — AB (ref 3.5–5.1)
Sodium: 138 (ref 137–147)

## 2022-07-20 LAB — COMPREHENSIVE METABOLIC PANEL
Calcium: 7.9 — AB (ref 8.7–10.7)
eGFR: 20

## 2022-07-21 LAB — BASIC METABOLIC PANEL
BUN: 38 — AB (ref 4–21)
CO2: 14 (ref 13–22)
Chloride: 119 — AB (ref 99–108)
Creatinine: 2.1 — AB (ref 0.5–1.1)
Glucose: 101
Potassium: 3.6 mEq/L (ref 3.5–5.1)
Sodium: 140 (ref 137–147)

## 2022-07-21 LAB — COMPREHENSIVE METABOLIC PANEL
Calcium: 8.1 — AB (ref 8.7–10.7)
eGFR: 22

## 2022-07-24 LAB — CBC AND DIFFERENTIAL
HCT: 28 — AB (ref 36–46)
Hemoglobin: 9.2 — AB (ref 12.0–16.0)
Neutrophils Absolute: 1455
Platelets: 141 10*3/uL — AB (ref 150–400)
WBC: 3.9

## 2022-07-24 LAB — BASIC METABOLIC PANEL
BUN: 36 — AB (ref 4–21)
CO2: 17 (ref 13–22)
Chloride: 116 — AB (ref 99–108)
Creatinine: 1.9 — AB (ref 0.5–1.1)
Glucose: 57
Potassium: 3.1 mEq/L — AB (ref 3.5–5.1)
Sodium: 142 (ref 137–147)

## 2022-07-24 LAB — CBC: RBC: 2.95 — AB (ref 3.87–5.11)

## 2022-07-24 LAB — COMPREHENSIVE METABOLIC PANEL
Calcium: 7.7 — AB (ref 8.7–10.7)
eGFR: 25

## 2022-07-26 ENCOUNTER — Non-Acute Institutional Stay: Payer: 59 | Admitting: Hospice

## 2022-07-26 DIAGNOSIS — R5381 Other malaise: Secondary | ICD-10-CM

## 2022-07-26 DIAGNOSIS — E43 Unspecified severe protein-calorie malnutrition: Secondary | ICD-10-CM

## 2022-07-26 DIAGNOSIS — I10 Essential (primary) hypertension: Secondary | ICD-10-CM

## 2022-07-26 DIAGNOSIS — R531 Weakness: Secondary | ICD-10-CM

## 2022-07-26 DIAGNOSIS — Z515 Encounter for palliative care: Secondary | ICD-10-CM

## 2022-07-26 NOTE — Progress Notes (Signed)
    Therapist, nutritional Palliative Care Consult Note Telephone: [phone]  Fax: [phone]     PATIENT NAME: Sonya Craig [address] [place] [address]   [phone] (home)  DOB: 04-Feb-1928 MRN: [id number] PRIMARY CARE PROVIDER:    Earnestine Mealing, MD,  [address] [place] [address] [phone]  REFERRING PROVIDER:   Earnestine Mealing, MD [address] [place],  [place] [address] [phone]  RESPONSIBLE PARTY:    Contact Information     Name Relation Home Work [place] Tapp Daughter  [phone]   Almond Lint Daughter [phone]     Littles,Laderitca Daughter   [phone]        I met face to face with patient in the facility. Palliative Care was asked to follow this patient by consultation request of  Earnestine Mealing, MD to address advance care planning and complex medical decision making. This is a follow up visit.   Boyd Kerbs is with patient during visit ASSESSMENT AND PLAN / RECOMMENDATIONS:   Advance Care Planning/Goals of Care: Goals include to maximize quality of life and symptom management.   CODE STATUS: DNR, Do Not Hospitalize.   Symptom Management/Plan: Protein calorie malnutrition: weight is  improving - 123 Ibs 07/24/22  103 Ibs 04/20/25 down from 116 pounds about a year ago, Albumin is 3.3 12/22/21.  Continue Boost BID for nutritional supplementation. Provide assistance during meals to ensure adequate oral intake.  Monitor weight per facility protocol.  Routine CBC CMP. If abnormal weight loss continues, possibility to look into hospice as an option for supportive care.  Hypertension: Managed with Lisinopril.   Weakness: Out of bed daily. Education reiterated on need for activity/Out of bed daily.Fall precautions  Debility: secondary advance age, multiple morbidities of hypertension, CKD 4, PCM, Ogilvie syndrome. Turn and reposition for comfort Use Gelfoam to help redistribute weight  and prevent skin breakdown; float bilateral heels.  Continue to provide ongoing supportive care  Follow up Palliative Care Visit: Palliative care will continue to follow for complex medical decision making, advance care planning, and clarification of goals. Return in 8 weeks or prn.  PPS: 30%  HOSPICE ELIGIBILITY/DIAGNOSIS: TBD  Chief Complaint: Palliative medicine follow-up visit.  HISTORY OF PRESENT ILLNESS:  Sonya Craig is a 89 y.o. year old female with multiple morbidities requiring close monitoring/management with high risk of complications and morbidity:  protein calorie malnutrition, hypertension, CKD stage 4, T2DM, debility, generalized weakness, OA.  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  Patient denies pain/discomfort.  FLACC 0. History obtained from review of EMR, discussion with primary team, family and/or patient. Records reviewed and summarized above. All 10 point systems reviewed and are negative except as documented in history of present illness above .  I spent 35 minutes providing this consultation; this includes time spent with patient/family, chart review and documentation. More than 50% of the time in this consultation was spent on counseling and coordinating communication   Thank you for the opportunity to participate in the care of Sonya Craig.  The palliative care team will continue to follow. Please call our office at [phone] if we can be of additional assistance.   Rosaura Carpenter, NP

## 2022-08-03 LAB — COMPREHENSIVE METABOLIC PANEL
Albumin: 3.1 — AB (ref 3.5–5.0)
Calcium: 8.5 — AB (ref 8.7–10.7)
Globulin: 2.1
eGFR: 20

## 2022-08-03 LAB — HEPATIC FUNCTION PANEL
ALT: 6 U/L — AB (ref 7–35)
AST: 13 (ref 13–35)
Alkaline Phosphatase: 35 (ref 25–125)
Bilirubin, Total: 0.4

## 2022-08-03 LAB — BASIC METABOLIC PANEL
BUN: 47 — AB (ref 4–21)
CO2: 16 (ref 13–22)
Chloride: 117 — AB (ref 99–108)
Creatinine: 2.2 — AB (ref 0.5–1.1)
Glucose: 59
Potassium: 4.2 mEq/L (ref 3.5–5.1)
Sodium: 140 (ref 137–147)

## 2022-08-21 LAB — BASIC METABOLIC PANEL: Potassium: 3.9 mEq/L (ref 3.5–5.1)

## 2022-08-22 ENCOUNTER — Encounter: Payer: Self-pay | Admitting: Nurse Practitioner

## 2022-08-22 ENCOUNTER — Non-Acute Institutional Stay (SKILLED_NURSING_FACILITY): Payer: Medicare Other | Admitting: Nurse Practitioner

## 2022-08-22 DIAGNOSIS — E876 Hypokalemia: Secondary | ICD-10-CM

## 2022-08-22 DIAGNOSIS — N184 Chronic kidney disease, stage 4 (severe): Secondary | ICD-10-CM

## 2022-08-22 DIAGNOSIS — I10 Essential (primary) hypertension: Secondary | ICD-10-CM | POA: Diagnosis not present

## 2022-08-22 DIAGNOSIS — R5381 Other malaise: Secondary | ICD-10-CM | POA: Diagnosis not present

## 2022-08-22 DIAGNOSIS — E43 Unspecified severe protein-calorie malnutrition: Secondary | ICD-10-CM

## 2022-08-22 NOTE — Progress Notes (Unsigned)
Location:  Other [place].  Nursing Home Room Number: [facility] 302A Place of Service:  SNF [phone]) [name], NP  PCP: Earnestine Mealing, MD  Patient Care Team: Earnestine Mealing, MD as PCP - General [facility])  Extended Emergency Contact Information Primary Emergency Contact: [name] Address: [address]          [place], [place] [address] Darden Amber of [place] Work Phone: [phone] Mobile Phone: [phone] Relation: Daughter Secondary Emergency Contact: [name] of [place] Home Phone: [phone] Relation: Daughter  Goals of care: Advanced Directive information    08/22/2022    2:57 PM  Advanced Directives  Does Patient Have a Medical Advance Directive? Yes  Type of Advance Directive Out of facility DNR (pink MOST or yellow form)  Does patient want to make changes to medical advance directive? No - Patient declined     Chief Complaint  Patient presents with   Medical Management of Chronic Issues    Medical Management of Chronic Issues.     HPI:  Pt is a 89 y.o. female seen today for medical management of chronic disease. Pt with hx of DM, htn, CKD. She stays in the bed mostly despite being encouraged to get oob to chair. Has small stage 1 pressure ulcer that nursing is monitor.  Pt denies pain, discomforts.  Reports she is doing well.  Pt denies any complaints and nursing without acute concerns.    Past Medical History:  Diagnosis Date   Delirium 04/27/2020   Diabetes mellitus without complication (HCC)    Hypercholesterolemia    Hypertension    Stage 3 chronic kidney disease due to type 2 diabetes mellitus [facility])    Past Surgical History:  Procedure Laterality Date   COLONOSCOPY N/A 09/24/2021   Procedure: COLONOSCOPY/BOWEL DECOMPRESSION;  Surgeon: Midge Minium, MD;  Location: [facility] ENDOSCOPY;  Service: Endoscopy;  Laterality: N/A;    No Known Allergies  Outpatient Encounter Medications as of 08/22/2022   Medication Sig   acetaminophen (TYLENOL) 325 MG tablet Take 2 tablets (650 mg total) by mouth every 6 (six) hours as needed for mild pain (or Fever >/= 101).   potassium chloride SA (KLOR-CON M) 20 MEQ tablet Take 20 mEq by mouth daily.   simethicone (MYLICON) 80 MG chewable tablet Chew 80 mg by mouth every 6 (six) hours as needed for flatulence.   Zinc Oxide (TRIPLE PASTE) 12.8 % ointment Apply 1 Application topically as needed for irritation.   [DISCONTINUED] [employer] [facility]) OINT Apply to sacrum,coccyx,buttocks topically as needed for redness.   No facility-administered encounter medications on file as of 08/22/2022.    Review of Systems  Constitutional:  Negative for activity change, appetite change, fatigue and unexpected weight change.  HENT:  Negative for congestion and hearing loss.   Eyes: Negative.   Respiratory:  Negative for cough and shortness of breath.   Cardiovascular:  Negative for chest pain, palpitations and leg swelling.  Gastrointestinal:  Negative for abdominal pain, constipation and diarrhea.  Genitourinary:  Negative for difficulty urinating and dysuria.  Musculoskeletal:  Negative for arthralgias and myalgias.  Skin:  Negative for color change and wound.  Neurological:  Negative for dizziness and weakness.  Psychiatric/Behavioral:  Negative for agitation, behavioral problems and confusion.      Immunization History  Administered Date(s) Administered   Influenza Inj Mdck Quad Pf 12/14/2017   Influenza,inj,Quad PF,6+ Mos 11/07/2019   Influenza-Unspecified 12/06/2021   Moderna SARS-COV2 Booster Vaccination 02/10/2020, 07/09/2020   Moderna  Sars-Covid-2 Vaccination 04/19/2019, 05/17/2019   PFIZER Comirnaty(Gray Top)Covid-19 Tri-Sucrose Vaccine 12/30/2021   Pneumococcal Conjugate-13 11/21/2015   Pneumococcal Polysaccharide-23 04/20/2005   Unspecified SARS-COV-2 Vaccination 11/12/2020, 07/19/2021   Zoster Recombinant(Shingrix) 12/14/2017    Pertinent  Health Maintenance Due  Topic Date Due   DEXA SCAN  Never done   INFLUENZA VACCINE  09/21/2022      09/25/2021   10:00 AM 09/25/2021    9:00 PM 09/26/2021    8:00 AM 09/26/2021    8:36 PM 09/27/2021    9:00 AM  Fall Risk  (RETIRED) Patient Fall Risk Level High fall risk High fall risk High fall risk High fall risk High fall risk   Functional Status Survey:    Vitals:   08/22/22 1451  BP: 132/64  Pulse: (!) 55  Resp: 17  Temp: 98.7 F (37.1 C)  SpO2: 98%  Weight: 123 lb 12.8 oz (56.2 kg)  Height: 5\' 3"  (1.6 m)   Body mass index is 21.93 kg/m. Physical Exam Constitutional:      General: She is not in acute distress.    Appearance: She is well-developed. She is not diaphoretic.  HENT:     Head: Normocephalic and atraumatic.     Mouth/Throat:     Pharynx: No oropharyngeal exudate.  Eyes:     Conjunctiva/sclera: Conjunctivae normal.     Pupils: Pupils are equal, round, and reactive to light.  Cardiovascular:     Rate and Rhythm: Normal rate and regular rhythm.     Heart sounds: Normal heart sounds.  Pulmonary:     Effort: Pulmonary effort is normal.     Breath sounds: Normal breath sounds.  Abdominal:     General: Bowel sounds are normal.     Palpations: Abdomen is soft.  Musculoskeletal:     Cervical back: Normal range of motion and neck supple.     Right lower leg: Edema (2+) present.     Left lower leg: Edema (2+) present.  Skin:    General: Skin is warm and dry.  Neurological:     Mental Status: She is alert.  Psychiatric:        Mood and Affect: Mood normal.     Labs reviewed: Recent Labs    09/24/21 1308 09/25/21 0825 09/25/21 1943 09/26/21 0538 09/27/21 0439 10/03/21 0000 07/21/22 0000 07/24/22 0000 08/03/22 0000 08/21/22 0000  NA 146* 142  --  134* 139   < > 140 142 140  --   K 3.8 2.4*   < > 3.2* 3.2*   < > 3.6 3.1* 4.2 3.9  CL 127* 123*  --  116* 119*   < > 119* 116* 117*  --   CO2 12* 15*  --  14* 16*   < > 14 17 16   --    GLUCOSE 123* 106*  --  148* 68*  --   --   --   --   --   BUN 43* 33*  --  26* 23   < > 38* 36* 47*  --   CREATININE 1.83* 1.51*  --  1.45* 1.49*   < > 2.1* 1.9* 2.2*  --   CALCIUM 8.7* 8.1*  --  7.7* 7.8*   < > 8.1* 7.7* 8.5*  --   MG 2.2 1.9  --   --   --   --   --   --   --   --   PHOS  --  1.6*  --   --   --   --   --   --   --   --    < > =  values in this interval not displayed.   Recent Labs    09/23/21 1421 10/03/21 0000 11/24/21 0000 12/22/21 0000 08/03/22 0000  AST 24  --  13 15 13   ALT 11  --  6* 4* 6*  ALKPHOS 52   < > 40 45 35  BILITOT 0.7  --   --   --   --   PROT 6.5  --   --   --   --   ALBUMIN 3.7   < > 3.3* 3.3* 3.1*   < > = values in this interval not displayed.   Recent Labs    09/25/21 0825 09/26/21 0538 09/27/21 0439 12/22/21 0000 03/27/22 0000 07/24/22 0000  WBC 6.5 7.8 5.7 3.8 3.9 3.9  NEUTROABS  --   --   --  1,695.00 1,622.00 1,455.00  HGB 10.4* 10.9* 10.0* 10.1* 10.4* 9.2*  HCT 31.2* 32.6* 29.9* 31* 32* 28*  MCV 94.3 92.9 93.7  --   --   --   PLT 167 176 158 178 151 141*   Lab Results  Component Value Date   TSH 0.956 04/17/2020   Lab Results  Component Value Date   HGBA1C 5.2 06/21/2022   Lab Results  Component Value Date   CHOL 147 12/22/2021   HDL 40 12/22/2021   LDLCALC 89 12/22/2021   TRIG 88 12/22/2021    Significant Diagnostic Results in last 30 days:  No results found.  Assessment/Plan 1. Essential hypertension Off all medications, blood pressures at goal.   2. Debility -continues to stay in bed, continues nursing support.   3. CKD (chronic kidney disease), stage IV (HCC) -Chronic and stable Encourage proper hydration Follow metabolic panel Avoid nephrotoxic meds (NSAIDS)  4. Hypokalemia -potassium stable on current supplement. Recent lab in normal range  5. Protein-calorie malnutrition, severe She has had positive weight gain over the last few months.  Family provides additional food for supplement.      Sonya Craig. [facility]ag[facility] [phone]

## 2022-09-04 LAB — BASIC METABOLIC PANEL
BUN: 40 — AB (ref 4–21)
CO2: 20 (ref 13–22)
Chloride: 116 — AB (ref 99–108)
Creatinine: 1.7 — AB (ref 0.5–1.1)
Glucose: 65
Potassium: 3.8 mEq/L (ref 3.5–5.1)
Sodium: 143 (ref 137–147)

## 2022-09-04 LAB — CBC AND DIFFERENTIAL
HCT: 26 — AB (ref 36–46)
Hemoglobin: 8.2 — AB (ref 12.0–16.0)
Neutrophils Absolute: 1664
Platelets: 146 10*3/uL — AB (ref 150–400)
WBC: 4

## 2022-09-04 LAB — COMPREHENSIVE METABOLIC PANEL
Calcium: 8.3 — AB (ref 8.7–10.7)
eGFR: 28

## 2022-09-04 LAB — CBC: RBC: 2.65 — AB (ref 3.87–5.11)

## 2022-09-11 LAB — CBC AND DIFFERENTIAL
HCT: 28 — AB (ref 36–46)
Hemoglobin: 8.9 — AB (ref 12.0–16.0)
Neutrophils Absolute: 1520
Platelets: 170 10*3/uL (ref 150–400)
WBC: 3.8

## 2022-09-11 LAB — CBC: RBC: 2.87 — AB (ref 3.87–5.11)

## 2022-09-21 LAB — COMPREHENSIVE METABOLIC PANEL
Albumin: 3.1 — AB (ref 3.5–5.0)
Calcium: 8.2 — AB (ref 8.7–10.7)
Globulin: 2.2
eGFR: 29

## 2022-09-21 LAB — BASIC METABOLIC PANEL
BUN: 40 — AB (ref 4–21)
CO2: 25 — AB (ref 13–22)
Chloride: 113 — AB (ref 99–108)
Creatinine: 1.7 — AB (ref 0.5–1.1)
Glucose: 62
Potassium: 3.5 mEq/L (ref 3.5–5.1)
Sodium: 144 (ref 137–147)

## 2022-09-21 LAB — HEPATIC FUNCTION PANEL
ALT: 6 U/L — AB (ref 7–35)
AST: 16 (ref 13–35)
Alkaline Phosphatase: 34 (ref 25–125)

## 2022-10-02 ENCOUNTER — Encounter: Payer: Self-pay | Admitting: Student

## 2022-10-02 ENCOUNTER — Non-Acute Institutional Stay (SKILLED_NURSING_FACILITY): Payer: Medicare Other | Admitting: Student

## 2022-10-02 DIAGNOSIS — N184 Chronic kidney disease, stage 4 (severe): Secondary | ICD-10-CM

## 2022-10-02 DIAGNOSIS — N9089 Other specified noninflammatory disorders of vulva and perineum: Secondary | ICD-10-CM | POA: Diagnosis not present

## 2022-10-02 DIAGNOSIS — F03B0 Unspecified dementia, moderate, without behavioral disturbance, psychotic disturbance, mood disturbance, and anxiety: Secondary | ICD-10-CM | POA: Diagnosis not present

## 2022-10-02 DIAGNOSIS — R5381 Other malaise: Secondary | ICD-10-CM | POA: Diagnosis not present

## 2022-10-02 NOTE — Progress Notes (Unsigned)
Location:  [facility]   Nursing Home Room Number: 302 A Place of Service:  SNF [phone]) Provider:  Earnestine Mealing, MD  Earnestine Mealing, MD  Patient Care Team: Earnestine Mealing, MD as PCP - General [facility] Medicine)  Extended Emergency Contact Information Primary Emergency Contact: [name] Address: [address]          [place], [place] [address] Darden Amber of [place] Work Phone: [phone] Mobile Phone: [phone] Relation: Daughter Secondary Emergency Contact: [name] of [place] Home Phone: [phone] Relation: Daughter  Code Status:  DNR Goals of care: Advanced Directive information    08/22/2022    2:57 PM  Advanced Directives  Does Patient Have a Medical Advance Directive? Yes  Type of Advance Directive Out of facility DNR (pink MOST or yellow form)  Does patient want to make changes to medical advance directive? No - Patient declined     Chief Complaint  Patient presents with   Acute Visit    Swelling    HPI:  Pt is a 89 y.o. female seen today for an acute visit for vulvar swelling. Patient is oriented to self but not to time. She says she doesn't have pain until pushing on the legs. She has lived here "for a year." And she "was in the hospital 2 weeks ago." Per chart review, patient was in the hospital >1 year ago.  Spoke to patient's daughter regarding current status and concern that her swelling is reflection of poor renal function. She has had CKD IV and now showing signs of poor filtration. Palliative care recommends she transitions to hospice. Daughter is in agreement. She plans to tell her siblings.    Past Medical History:  Diagnosis Date   Delirium 04/27/2020   Diabetes mellitus without complication (HCC)    Hypercholesterolemia    Hypertension    Stage 3 chronic kidney disease due to type 2 diabetes mellitus [facility])    Past Surgical History:  Procedure Laterality Date   COLONOSCOPY N/A  09/24/2021   Procedure: COLONOSCOPY/BOWEL DECOMPRESSION;  Surgeon: Midge Minium, MD;  Location: [facility] ENDOSCOPY;  Service: Endoscopy;  Laterality: N/A;    No Known Allergies  Allergies as of 10/02/2022   No Known Allergies      Medication List        Accurate as of October 02, 2022 12:01 PM. If you have any questions, ask your nurse or doctor.          acetaminophen 325 MG tablet Commonly known as: TYLENOL Take 2 tablets (650 mg total) by mouth every 6 (six) hours as needed for mild pain (or Fever >/= 101).   bismuth subsalicylate 262 MG/15ML suspension Commonly known as: PEPTO BISMOL Take 30 mLs by mouth every hour as needed for diarrhea or loose stools.   furosemide 40 MG tablet Commonly known as: LASIX Take 40 mg by mouth once.   potassium chloride SA 20 MEQ tablet Commonly known as: KLOR-CON M Take 20 mEq by mouth daily.   simethicone 80 MG chewable tablet Commonly known as: MYLICON Chew 80 mg by mouth every 6 (six) hours as needed for flatulence.   sodium bicarbonate 650 MG tablet Take 650 mg by mouth 2 (two) times daily.   Zinc Oxide 12.8 % ointment Commonly known as: TRIPLE PASTE Apply 1 Application topically as needed for irritation.        Review of Systems  Immunization History  Administered Date(s) Administered   Influenza Inj Mdck Quad Pf 12/14/2017  Influenza,inj,Quad PF,6+ Mos 11/07/2019   Influenza-Unspecified 12/06/2021   Moderna SARS-COV2 Booster Vaccination 02/10/2020, 07/09/2020   Moderna Sars-Covid-2 Vaccination 04/19/2019, 05/17/2019   PFIZER Comirnaty(Gray Top)Covid-19 Tri-Sucrose Vaccine 12/30/2021   Pneumococcal Conjugate-13 11/21/2015   Pneumococcal Polysaccharide-23 04/20/2005   Unspecified SARS-COV-2 Vaccination 11/12/2020, 07/19/2021   Zoster Recombinant(Shingrix) 12/14/2017   Pertinent  Health Maintenance Due  Topic Date Due   DEXA SCAN  Never done   INFLUENZA VACCINE  09/21/2022      09/25/2021   10:00 AM 09/25/2021     9:00 PM 09/26/2021    8:00 AM 09/26/2021    8:36 PM 09/27/2021    9:00 AM  Fall Risk  (RETIRED) Patient Fall Risk Level High fall risk High fall risk High fall risk High fall risk High fall risk   Functional Status Survey:    Vitals:   10/02/22 1154  BP: (!) 143/64  Pulse: 75  Resp: 18  Temp: (!) 97 F (36.1 C)  SpO2: 99%  Weight: 119 lb (54 kg)  Height: 5\' 3"  (1.6 m)   Body mass index is 21.08 kg/m. Physical Exam Cardiovascular:     Rate and Rhythm: Normal rate.     Pulses: Normal pulses.  Pulmonary:     Effort: Pulmonary effort is normal.     Breath sounds: Normal breath sounds.  Abdominal:     General: Bowel sounds are normal. There is distension.     Palpations: Abdomen is soft.  Genitourinary:    Comments: Edematous vulva Musculoskeletal:     Comments: 2+ pitting, dependent edema of bilateral lower extremities to the groin.   Neurological:     Mental Status: She is alert.     Labs reviewed: Recent Labs    07/21/22 0000 07/24/22 0000 08/03/22 0000 08/21/22 0000  NA 140 142 140  --   K 3.6 3.1* 4.2 3.9  CL 119* 116* 117*  --   CO2 14 17 16   --   BUN 38* 36* 47*  --   CREATININE 2.1* 1.9* 2.2*  --   CALCIUM 8.1* 7.7* 8.5*  --    Recent Labs    11/24/21 0000 12/22/21 0000 08/03/22 0000  AST 13 15 13   ALT 6* 4* 6*  ALKPHOS 40 45 35  ALBUMIN 3.3* 3.3* 3.1*   Recent Labs    12/22/21 0000 03/27/22 0000 07/24/22 0000  WBC 3.8 3.9 3.9  NEUTROABS 1,695.00 1,622.00 1,455.00  HGB 10.1* 10.4* 9.2*  HCT 31* 32* 28*  PLT 178 151 141*   Lab Results  Component Value Date   TSH 0.956 04/17/2020   Lab Results  Component Value Date   HGBA1C 5.2 06/21/2022   Lab Results  Component Value Date   CHOL 147 12/22/2021   HDL 40 12/22/2021   LDLCALC 89 12/22/2021   TRIG 88 12/22/2021    Significant Diagnostic Results in last 30 days:  No results found.  Assessment/Plan Vulvar edema  CKD (chronic kidney disease), stage IV (HCC) Patient with  significant swelling of bilateral lower extremities and vulva. Discussed with family concern patient is having poor filtration of the kidneys leading to this finding. Will place hospice referral at this time. OTO Lasix 40 mg for swelling due to symptom control.    Family/ staff Communication: Aggie Cosier  Labs/tests ordered:   None

## 2022-10-26 LAB — CBC AND DIFFERENTIAL
HCT: 29 — AB (ref 36–46)
Hemoglobin: 9.1 — AB (ref 12.0–16.0)
Neutrophils Absolute: 1606
Platelets: 181 10*3/uL (ref 150–400)
WBC: 3.7

## 2022-10-26 LAB — BASIC METABOLIC PANEL
BUN: 25 — AB (ref 4–21)
CO2: 28 — AB (ref 13–22)
Chloride: 107 (ref 99–108)
Creatinine: 1.5 — AB (ref 0.5–1.1)
Glucose: 108
Potassium: 3.5 meq/L (ref 3.5–5.1)
Sodium: 143 (ref 137–147)

## 2022-10-26 LAB — COMPREHENSIVE METABOLIC PANEL
Calcium: 8.3 — AB (ref 8.7–10.7)
eGFR: 34

## 2022-10-26 LAB — CBC: RBC: 2.9 — AB (ref 3.87–5.11)

## 2022-10-30 ENCOUNTER — Non-Acute Institutional Stay (SKILLED_NURSING_FACILITY): Admitting: Student

## 2022-10-30 ENCOUNTER — Encounter: Payer: Self-pay | Admitting: Student

## 2022-10-30 DIAGNOSIS — F03B0 Unspecified dementia, moderate, without behavioral disturbance, psychotic disturbance, mood disturbance, and anxiety: Secondary | ICD-10-CM

## 2022-10-30 DIAGNOSIS — I779 Disorder of arteries and arterioles, unspecified: Secondary | ICD-10-CM

## 2022-10-30 DIAGNOSIS — I10 Essential (primary) hypertension: Secondary | ICD-10-CM

## 2022-10-30 DIAGNOSIS — E43 Unspecified severe protein-calorie malnutrition: Secondary | ICD-10-CM | POA: Diagnosis not present

## 2022-10-30 DIAGNOSIS — N184 Chronic kidney disease, stage 4 (severe): Secondary | ICD-10-CM

## 2022-10-30 NOTE — Progress Notes (Signed)
Location:  Other [facility]) Nursing Home Room Number: [facility] 302-A Place of Service:  SNF [phone]) Provider:  Earnestine Mealing, MD  Patient Care Team: Earnestine Mealing, MD as PCP - General [facility] Medicine)  Extended Emergency Contact Information Primary Emergency Contact: Coble,Teresa Tapp Address: [address]          [place], [place] [address] [place] of [place] Work Phone: [phone] Mobile Phone: [phone] Relation: Daughter Secondary Emergency Contact: [name] Home Phone: [phone] Relation: Daughter  Code Status:  DNR Goals of care: Advanced Directive information    10/30/2022   10:06 AM  Advanced Directives  Does Patient Have a Medical Advance Directive? Yes  Type of Advance Directive Out of facility DNR (pink MOST or yellow form)  Does patient want to make changes to medical advance directive? No - Patient declined  Pre-existing out of facility DNR order (yellow form or pink MOST form) Pink MOST form placed in chart (order not valid for inpatient use);Yellow form placed in chart (order not valid for inpatient use)     Chief Complaint  Patient presents with   Medical Management of Chronic Issues    Routine Visit    Immunizations    DTAP, Influenza and Covid   Quality Metric Gaps    DG Bone Density.    HPI:  Pt is a 89 y.o. female seen today for medical management of chronic diseases.    She states she feels fine and everything as been fine. She knows she will be 89 on her birthday in [birth year], but can't tell the month, year, or location. She thinks her surgery was 2 weeks ago.   Nursing with continued concerns regarding swelling progression. Patient is on hospice, comfort measures. No response to lasix with recent trial. Continue supportive care.   Past Medical History:  Diagnosis Date   Delirium 04/27/2020   Diabetes mellitus without complication (HCC)    Hypercholesterolemia    Hypertension     Stage 3 chronic kidney disease due to type 2 diabetes mellitus [facility])    Past Surgical History:  Procedure Laterality Date   COLONOSCOPY N/A 09/24/2021   Procedure: COLONOSCOPY/BOWEL DECOMPRESSION;  Surgeon: Midge Minium, MD;  Location: [facility] ENDOSCOPY;  Service: Endoscopy;  Laterality: N/A;    No Known Allergies  Outpatient Encounter Medications as of 10/30/2022  Medication Sig   acetaminophen (TYLENOL) 325 MG tablet Take 2 tablets (650 mg total) by mouth every 6 (six) hours as needed for mild pain (or Fever >/= 101).   bismuth subsalicylate (PEPTO BISMOL) 262 MG/15ML suspension Take 30 mLs by mouth every hour as needed for diarrhea or loose stools.   potassium chloride SA (KLOR-CON M) 20 MEQ tablet Take 20 mEq by mouth daily.   simethicone (MYLICON) 80 MG chewable tablet Chew 80 mg by mouth every 6 (six) hours as needed for flatulence.   sodium bicarbonate 650 MG tablet Take 650 mg by mouth 2 (two) times daily.   Zinc Oxide (TRIPLE PASTE) 12.8 % ointment Apply 1 Application topically as needed for irritation.   [DISCONTINUED] furosemide (LASIX) 40 MG tablet Take 40 mg by mouth once.   No facility-administered encounter medications on file as of 10/30/2022.    Review of Systems  Immunization History  Administered Date(s) Administered   Influenza Inj Mdck Quad Pf 12/14/2017   Influenza,inj,Quad PF,6+ Mos 11/07/2019   Influenza-Unspecified 12/06/2021   Moderna SARS-COV2 Booster Vaccination 02/10/2020, 07/09/2020   Moderna Sars-Covid-2 Vaccination 04/19/2019, 05/17/2019  PFIZER Comirnaty(Gray Top)Covid-19 Tri-Sucrose Vaccine 12/30/2021   Pneumococcal Conjugate-13 11/21/2015   Pneumococcal Polysaccharide-23 04/20/2005   Unspecified SARS-COV-2 Vaccination 11/12/2020, 07/19/2021   Zoster Recombinant(Shingrix) 12/14/2017   Pertinent  Health Maintenance Due  Topic Date Due   DEXA SCAN  Never done   INFLUENZA VACCINE  09/21/2022      09/25/2021   10:00 AM 09/25/2021    9:00 PM 09/26/2021     8:00 AM 09/26/2021    8:36 PM 09/27/2021    9:00 AM  Fall Risk  (RETIRED) Patient Fall Risk Level High fall risk High fall risk High fall risk High fall risk High fall risk   Functional Status Survey:    Vitals:   10/30/22 0952  BP: (!) 152/62  Pulse: 68  Resp: 20  Temp: (!) 97.5 F (36.4 C)  SpO2: 99%  Weight: 132 lb 3.2 oz (60 kg)  Height: 5\' 3"  (1.6 m)   Body mass index is 23.42 kg/m. Physical Exam Cardiovascular:     Rate and Rhythm: Normal rate.  Pulmonary:     Effort: Pulmonary effort is normal.     Breath sounds: Normal breath sounds.  Abdominal:     General: There is distension.  Musculoskeletal:        General: Swelling present.     Comments: Tense, dependent p3+ pitting edema bilateral lower extremities  Neurological:     Mental Status: She is alert. Mental status is at baseline. She is disoriented.     Labs reviewed: Recent Labs    09/04/22 0000 09/21/22 0000 10/26/22 0000  NA 143 144 143  K 3.8 3.5 3.5  CL 116* 113* 107  CO2 20 25* 28*  BUN 40* 40* 25*  CREATININE 1.7* 1.7* 1.5*  CALCIUM 8.3* 8.2* 8.3*   Recent Labs    12/22/21 0000 08/03/22 0000 09/21/22 0000  AST 15 13 16   ALT 4* 6* 6*  ALKPHOS 45 35 34  ALBUMIN 3.3* 3.1* 3.1*   Recent Labs    09/04/22 0000 09/11/22 0000 10/26/22 0000  WBC 4.0 3.8 3.7  NEUTROABS 1,664.00 1,520.00 1,606.00  HGB 8.2* 8.9* 9.1*  HCT 26* 28* 29*  PLT 146* 170 181   Lab Results  Component Value Date   TSH 0.956 04/17/2020   Lab Results  Component Value Date   HGBA1C 5.2 06/21/2022   Lab Results  Component Value Date   CHOL 147 12/22/2021   HDL 40 12/22/2021   LDLCALC 89 12/22/2021   TRIG 88 12/22/2021    Significant Diagnostic Results in last 30 days:  No results found.  Assessment/Plan CKD (chronic kidney disease), stage IV (HCC)  Protein-calorie malnutrition, severe  Carotid artery disease, unspecified laterality, unspecified type (HCC)  Moderate dementia, unspecified dementia  type, unspecified whether behavioral, psychotic, or mood disturbance or anxiety (HCC)  Essential hypertension Patient with progression of CKD now with incresaed swelling. NO desire for HD, transitioned to hospice and continue supporrtive care. Weight gain is likely due to swelling in legs. BP slightly elevated. Memory is stable, however function is poor as she does not get out of bed often.  Family/ staff Communication: nursing  Labs/tests ordered:  none

## 2022-11-09 ENCOUNTER — Encounter: Payer: Self-pay | Admitting: Nurse Practitioner

## 2022-11-09 ENCOUNTER — Non-Acute Institutional Stay (SKILLED_NURSING_FACILITY): Payer: Medicare Other | Admitting: Nurse Practitioner

## 2022-11-09 DIAGNOSIS — R600 Localized edema: Secondary | ICD-10-CM | POA: Diagnosis not present

## 2022-11-09 DIAGNOSIS — R5381 Other malaise: Secondary | ICD-10-CM | POA: Diagnosis not present

## 2022-11-09 DIAGNOSIS — I10 Essential (primary) hypertension: Secondary | ICD-10-CM | POA: Diagnosis not present

## 2022-11-09 NOTE — Progress Notes (Signed)
Location:  [facility].  Nursing Home Room Number: [facility] [address]A Place of Service:  SNF [phone]) Sonya Chatters, NP  PCP: Sonya Mealing, MD  Patient Care Team: Sonya Mealing, MD as PCP - General [facility])  Extended Emergency Contact Information Primary Emergency Contact: [name] Address: [address]          [place], [place] [address] Darden [place] of [place] Work Phone: [phone] Mobile Phone: [phone] Relation: Daughter Secondary Emergency Contact: [name] of [place] Home Phone: [phone] Relation: Daughter  Goals of care: Advanced Directive information    11/09/2022   10:36 AM  Advanced Directives  Does Patient Have a Medical Advance Directive? Yes  Type of Advance Directive Out of facility DNR (pink MOST or yellow form)  Does patient want to make changes to medical advance directive? No - Patient declined     Chief Complaint  Patient presents with   Acute Visit    Swollen Legs.     HPI:  Pt is a 89 y.o. female seen today for an acute visit for Swollen Legs.  Hospice nurse reports family asking about starting medication for swelling in bilateral legs and bp.  She is under hospice for management and comfort care due to progressive CKD.  Blood pressure are variable but pt denies chest pains, palpitation or headache. Reports she had rare headache recently due to trash can dropping in her room States she has had LE edema for years but does not have any pain due to the swelling She is bed bound and does not like getting up due to OA in knees.  Also reports pain in right knee is minimal most of the time but occasionally will be aware of pain. Does not feel like she needs medication for it.    Past Medical History:  Diagnosis Date   Delirium 04/27/2020   Diabetes mellitus without complication (HCC)    Hypercholesterolemia    Hypertension    Stage 3 chronic kidney disease due to type 2 diabetes mellitus  [facility])    Past Surgical History:  Procedure Laterality Date   COLONOSCOPY N/A 09/24/2021   Procedure: COLONOSCOPY/BOWEL DECOMPRESSION;  Surgeon: Midge Minium, MD;  Location: [facility] ENDOSCOPY;  Service: Endoscopy;  Laterality: N/A;    No Known Allergies  Outpatient Encounter Medications as of 11/09/2022  Medication Sig   acetaminophen (TYLENOL) 325 MG tablet Take 2 tablets (650 mg total) by mouth every 6 (six) hours as needed for mild pain (or Fever >/= 101).   potassium chloride SA (KLOR-CON M) 20 MEQ tablet Take 20 mEq by mouth daily.   simethicone (MYLICON) 80 MG chewable tablet Chew 80 mg by mouth every 6 (six) hours as needed for flatulence.   sodium bicarbonate 650 MG tablet Take 650 mg by mouth 2 (two) times daily.   Zinc Oxide (TRIPLE PASTE) 12.8 % ointment Apply 1 Application topically as needed for irritation.   [DISCONTINUED] bismuth subsalicylate (PEPTO BISMOL) 262 MG/15ML suspension Take 30 mLs by mouth every hour as needed for diarrhea or loose stools.   No facility-administered encounter medications on file as of 11/09/2022.    Review of Systems  Constitutional:  Negative for activity change, appetite change, fatigue and unexpected weight change.  HENT:  Negative for congestion and hearing loss.   Eyes: Negative.   Respiratory:  Negative for cough and shortness of breath.   Cardiovascular:  Positive for leg swelling. Negative for chest pain and palpitations.  Gastrointestinal:  Negative for abdominal pain,  constipation and diarrhea.  Genitourinary:  Negative for difficulty urinating and dysuria.  Musculoskeletal:  Positive for arthralgias. Negative for myalgias.  Skin:  Negative for color change and wound.  Neurological:  Positive for weakness. Negative for dizziness.  Psychiatric/Behavioral:  Positive for confusion. Negative for agitation and behavioral problems.     Immunization History  Administered Date(s) Administered   Influenza Inj Mdck Quad Pf 12/14/2017    Influenza,inj,Quad PF,6+ Mos 11/07/2019   Influenza-Unspecified 12/06/2021   Moderna SARS-COV2 Booster Vaccination 02/10/2020, 07/09/2020   Moderna Sars-Covid-2 Vaccination 04/19/2019, 05/17/2019   PFIZER Comirnaty(Gray Top)Covid-19 Tri-Sucrose Vaccine 12/30/2021   PNEUMOCOCCAL CONJUGATE-20 05/23/2022   Pneumococcal Conjugate-13 11/21/2015   Pneumococcal Polysaccharide-23 04/20/2005   Unspecified SARS-COV-2 Vaccination 11/12/2020, 07/19/2021   Zoster Recombinant(Shingrix) 12/14/2017   Pertinent  Health Maintenance Due  Topic Date Due   DEXA SCAN  Never done   INFLUENZA VACCINE  09/21/2022      09/25/2021   10:00 AM 09/25/2021    9:00 PM 09/26/2021    8:00 AM 09/26/2021    8:36 PM 09/27/2021    9:00 AM  Fall Risk  (RETIRED) Patient Fall Risk Level High fall risk High fall risk High fall risk High fall risk High fall risk   Functional Status Survey:    Vitals:   11/09/22 1031 11/09/22 1038  BP: (!) 175/74 130/70  Pulse: 74   Resp: 20   Temp: (!) 97.1 F (36.2 C)   SpO2: 99%   Weight: 132 lb 3.2 oz (60 kg)   Height: 5\' 3"  (1.6 m)    Body mass index is 23.42 kg/m. Physical Exam Constitutional:      General: She is not in acute distress.    Appearance: She is well-developed. She is not diaphoretic.  HENT:     Head: Normocephalic and atraumatic.     Mouth/Throat:     Pharynx: No oropharyngeal exudate.  Eyes:     Conjunctiva/sclera: Conjunctivae normal.     Pupils: Pupils are equal, round, and reactive to light.  Cardiovascular:     Rate and Rhythm: Normal rate and regular rhythm.     Heart sounds: Normal heart sounds.  Pulmonary:     Effort: Pulmonary effort is normal.     Breath sounds: Normal breath sounds.  Abdominal:     General: Bowel sounds are normal.     Palpations: Abdomen is soft.  Musculoskeletal:     Cervical back: Normal range of motion and neck supple.     Right lower leg: Edema (3+) present.     Left lower leg: Edema (3+) present.  Skin:    General:  Skin is warm and dry.  Neurological:     Mental Status: She is alert.  Psychiatric:        Mood and Affect: Mood normal.     Labs reviewed: Recent Labs    09/04/22 0000 09/21/22 0000 10/26/22 0000  NA 143 144 143  K 3.8 3.5 3.5  CL 116* 113* 107  CO2 20 25* 28*  BUN 40* 40* 25*  CREATININE 1.7* 1.7* 1.5*  CALCIUM 8.3* 8.2* 8.3*   Recent Labs    12/22/21 0000 08/03/22 0000 09/21/22 0000  AST 15 13 16   ALT 4* 6* 6*  ALKPHOS 45 35 34  ALBUMIN 3.3* 3.1* 3.1*   Recent Labs    09/04/22 0000 09/11/22 0000 10/26/22 0000  WBC 4.0 3.8 3.7  NEUTROABS 1,664.00 1,520.00 1,606.00  HGB 8.2* 8.9* 9.1*  HCT 26* 28* 29*  PLT 146* 170 181   Lab Results  Component Value Date   TSH 0.956 04/17/2020   Lab Results  Component Value Date   HGBA1C 5.2 06/21/2022   Lab Results  Component Value Date   CHOL 147 12/22/2021   HDL 40 12/22/2021   LDLCALC 89 12/22/2021   TRIG 88 12/22/2021    Significant Diagnostic Results in last 30 days:  No results found.  Assessment/Plan 1. Essential hypertension Noted elevated blood pressure however without symptoms at this time, she is under hospice care and would not recommending increasing/adding medications at this time.    2. Debility Ongoing, continue supportive care  3. Leg edema -ongoing and stable, not providing any pain or discomfort. Will monitor     Gretchen Weinfeld K. [facility] [phone]

## 2022-11-23 LAB — HEPATIC FUNCTION PANEL
ALT: 8 U/L (ref 7–35)
AST: 18 (ref 13–35)
Alkaline Phosphatase: 46 (ref 25–125)
Bilirubin, Total: 0.4

## 2022-11-23 LAB — COMPREHENSIVE METABOLIC PANEL
Albumin: 2.9 — AB (ref 3.5–5.0)
Calcium: 8.3 — AB (ref 8.7–10.7)
Globulin: 2.2

## 2022-11-23 LAB — BASIC METABOLIC PANEL
BUN: 22 — AB (ref 4–21)
Chloride: 104 (ref 99–108)
Creatinine: 1.3 — AB (ref 0.5–1.1)
Glucose: 113
Potassium: 3 meq/L — AB (ref 3.5–5.1)
Sodium: 144 (ref 137–147)

## 2022-12-21 ENCOUNTER — Non-Acute Institutional Stay (SKILLED_NURSING_FACILITY): Payer: Medicare Other | Admitting: Nurse Practitioner

## 2022-12-21 ENCOUNTER — Encounter: Payer: Self-pay | Admitting: Nurse Practitioner

## 2022-12-21 DIAGNOSIS — I10 Essential (primary) hypertension: Secondary | ICD-10-CM

## 2022-12-21 DIAGNOSIS — E43 Unspecified severe protein-calorie malnutrition: Secondary | ICD-10-CM | POA: Diagnosis not present

## 2022-12-21 DIAGNOSIS — R600 Localized edema: Secondary | ICD-10-CM

## 2022-12-21 DIAGNOSIS — K5981 Ogilvie syndrome: Secondary | ICD-10-CM

## 2022-12-21 DIAGNOSIS — F03B0 Unspecified dementia, moderate, without behavioral disturbance, psychotic disturbance, mood disturbance, and anxiety: Secondary | ICD-10-CM | POA: Diagnosis not present

## 2022-12-21 DIAGNOSIS — N184 Chronic kidney disease, stage 4 (severe): Secondary | ICD-10-CM

## 2022-12-21 NOTE — Progress Notes (Signed)
Location:  [facility].  Nursing Home Room Number: [facility] [address] Place of Service:  SNF [phone]) [name], NP  PCP: Earnestine Mealing, MD  Patient Care Team: Earnestine Mealing, MD as PCP - General [facility])  Extended Emergency Contact Information Primary Emergency Contact: [name] Address: [address]          [place], [place] [address] [place]arden [place] of [place] Work Phone: [phone] Mobile Phone: [phone] Relation: Daughter Secondary Emergency Contact: [name] of [place] Home Phone: [phone] Relation: Daughter  Goals of care: Advanced Directive information    12/21/2022    9:46 AM  Advanced Directives  Does Patient Have a Medical Advance Directive? Yes  Type of Advance Directive Out of facility DNR (pink MOST or yellow form)  Does patient want to make changes to medical advance directive? No - Patient declined     Chief Complaint  Patient presents with   Medical Management of Chronic Issues    Medical Management of Chronic Issues.     HPI:  Pt is a 89 y.o. female seen today for medical management of chronic disease. Pt with hx of CKD, dementia, debility, LE edema.  She is now on hospice services due to progression of CKD and does not wish to have HD.  She is doing well at this time. No signs of pain.  LE edema remain unchanged.  Nursing has no acute concerns.  Hospice nurse visited with her recently as well.  She remains bed bound by choice.   Past Medical History:  Diagnosis Date   Delirium 04/27/2020   Diabetes mellitus without complication (HCC)    Hypercholesterolemia    Hypertension    Stage 3 chronic kidney disease due to type 2 diabetes mellitus [facility])    Past Surgical History:  Procedure Laterality Date   COLONOSCOPY N/A 09/24/2021   Procedure: COLONOSCOPY/BOWEL DECOMPRESSION;  Surgeon: Midge Minium, MD;  Location: [facility] ENDOSCOPY;  Service: Endoscopy;  Laterality: N/A;    No Known  Allergies  Outpatient Encounter Medications as of 12/21/2022  Medication Sig   acetaminophen (TYLENOL) 325 MG tablet Take 2 tablets (650 mg total) by mouth every 6 (six) hours as needed for mild pain (or Fever >/= 101).   potassium chloride SA (KLOR-CON M) 20 MEQ tablet Take 20 mEq by mouth daily.   simethicone (MYLICON) 80 MG chewable tablet Chew 80 mg by mouth every 6 (six) hours as needed for flatulence.   sodium bicarbonate 650 MG tablet Take 650 mg by mouth 2 (two) times daily.   Zinc Oxide (TRIPLE PASTE) 12.8 % ointment Apply 1 Application topically as needed for irritation.   No facility-administered encounter medications on file as of 12/21/2022.    Review of Systems  Constitutional:  Negative for activity change, appetite change, fatigue and unexpected weight change.  HENT:  Negative for congestion and hearing loss.   Eyes: Negative.   Respiratory:  Negative for cough and shortness of breath.   Cardiovascular:  Positive for leg swelling. Negative for chest pain and palpitations.  Gastrointestinal:  Negative for abdominal pain, constipation and diarrhea.  Genitourinary:  Negative for difficulty urinating and dysuria.  Musculoskeletal:  Negative for arthralgias and myalgias.  Skin:  Negative for color change and wound.  Neurological:  Negative for dizziness and weakness.  Psychiatric/Behavioral:  Positive for confusion. Negative for agitation and behavioral problems.      Immunization History  Administered Date(s) Administered   Influenza Inj Mdck Quad Pf 12/14/2017   Influenza,inj,Quad  PF,6+ Mos 11/07/2019   Influenza-Unspecified 12/06/2021, 12/13/2022   Moderna SARS-COV2 Booster Vaccination 02/10/2020, 07/09/2020   Moderna Sars-Covid-2 Vaccination 04/19/2019, 05/17/2019, 12/30/2021   PFIZER Comirnaty(Gray Top)Covid-19 Tri-Sucrose Vaccine 12/30/2021   PNEUMOCOCCAL CONJUGATE-20 05/23/2022   Pneumococcal Conjugate-13 11/21/2015   Pneumococcal Polysaccharide-23 04/20/2005    Unspecified SARS-COV-2 Vaccination 11/12/2020, 07/19/2021   Zoster Recombinant(Shingrix) 12/14/2017   Pertinent  Health Maintenance Due  Topic Date Due   DEXA SCAN  Never done   INFLUENZA VACCINE  Completed      09/25/2021   10:00 AM 09/25/2021    9:00 PM 09/26/2021    8:00 AM 09/26/2021    8:36 PM 09/27/2021    9:00 AM  Fall Risk  (RETIRED) Patient Fall Risk Level High fall risk High fall risk High fall risk High fall risk High fall risk   Functional Status Survey:    Vitals:   12/21/22 0942 12/21/22 0949  BP: (!) 167/94 (!) 160/90  Pulse: (!) 102   Resp: 20   Temp: (!) 97.2 F (36.2 C)   SpO2: 97%   Weight: 137 lb 6.4 oz (62.3 kg)   Height: 5\' 3"  (1.6 m)    Body mass index is 24.34 kg/m. Physical Exam Constitutional:      General: She is not in acute distress.    Appearance: She is well-developed. She is not diaphoretic.  HENT:     Head: Normocephalic and atraumatic.     Mouth/Throat:     Pharynx: No oropharyngeal exudate.  Eyes:     Conjunctiva/sclera: Conjunctivae normal.     Pupils: Pupils are equal, round, and reactive to light.  Cardiovascular:     Rate and Rhythm: Normal rate and regular rhythm.     Heart sounds: Normal heart sounds.  Pulmonary:     Effort: Pulmonary effort is normal.     Breath sounds: Normal breath sounds.  Abdominal:     General: Bowel sounds are normal.     Palpations: Abdomen is soft.  Musculoskeletal:     Cervical back: Normal range of motion and neck supple.     Right lower leg: Edema present.     Left lower leg: Edema present.  Skin:    General: Skin is warm and dry.  Neurological:     Mental Status: She is alert. Mental status is at baseline.     Motor: Weakness present.     Gait: Gait abnormal.  Psychiatric:        Mood and Affect: Mood normal.     Labs reviewed: Recent Labs    09/04/22 0000 09/21/22 0000 10/26/22 0000 11/23/22 0000  NA 143 144 143 144  K 3.8 3.5 3.5 3.0*  CL 116* 113* 107 104  CO2 20 25* 28*  --    BUN 40* 40* 25* 22*  CREATININE 1.7* 1.7* 1.5* 1.3*  CALCIUM 8.3* 8.2* 8.3* 8.3*   Recent Labs    08/03/22 0000 09/21/22 0000 11/23/22 0000  AST 13 16 18   ALT 6* 6* 8  ALKPHOS 35 34 46  ALBUMIN 3.1* 3.1* 2.9*   Recent Labs    09/04/22 0000 09/11/22 0000 10/26/22 0000  WBC 4.0 3.8 3.7  NEUTROABS 1,664.00 1,520.00 1,606.00  HGB 8.2* 8.9* 9.1*  HCT 26* 28* 29*  PLT 146* 170 181   Lab Results  Component Value Date   TSH 0.956 04/17/2020   Lab Results  Component Value Date   HGBA1C 5.2 06/21/2022   Lab Results  Component Value Date  CHOL 147 12/22/2021   HDL 40 12/22/2021   LDLCALC 89 12/22/2021   TRIG 88 12/22/2021    Significant Diagnostic Results in last 30 days:  No results found.  Assessment/Plan 1. Essential hypertension Bp is elevated however she is on hospice services and without symptoms of chest pains, headaches, palpitations. To avoid pill burden will not escalate treatment at this time.   2. Leg edema -stable, no worsening of symptoms  3. Protein-calorie malnutrition, severe -encouraged to liberalize diet. To have protein supplement in addition to smallest meal of the day.  4. Moderate dementia, unspecified dementia type, unspecified whether behavioral, psychotic, or mood disturbance or anxiety (HCC) -Stable, no acute changes in cognitive or functional status, continue supportive care.   5. 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     Janene Harvey. [facility] [phone]

## 2023-01-31 ENCOUNTER — Non-Acute Institutional Stay (SKILLED_NURSING_FACILITY): Payer: Medicare Other | Admitting: Student

## 2023-01-31 ENCOUNTER — Encounter: Payer: Self-pay | Admitting: Student

## 2023-01-31 DIAGNOSIS — N184 Chronic kidney disease, stage 4 (severe): Secondary | ICD-10-CM | POA: Diagnosis not present

## 2023-01-31 DIAGNOSIS — R600 Localized edema: Secondary | ICD-10-CM

## 2023-01-31 DIAGNOSIS — I10 Essential (primary) hypertension: Secondary | ICD-10-CM

## 2023-01-31 DIAGNOSIS — F03B0 Unspecified dementia, moderate, without behavioral disturbance, psychotic disturbance, mood disturbance, and anxiety: Secondary | ICD-10-CM | POA: Diagnosis not present

## 2023-01-31 DIAGNOSIS — E43 Unspecified severe protein-calorie malnutrition: Secondary | ICD-10-CM | POA: Diagnosis not present

## 2023-01-31 NOTE — Progress Notes (Signed)
Location:  [facility].  Nursing Home Room Number: [facility] [address] Place of Service:  SNF [phone]) Provider:  Earnestine Mealing, MD  Patient Care Team: Earnestine Mealing, MD as PCP - General [facility])  Extended Emergency Contact Information Primary Emergency Contact: Coble,Teresa Tapp Address: [address]          [place], [place] [address] [place] of [place] Work Phone: [phone] Mobile Phone: [phone] Relation: Daughter Secondary Emergency Contact: [name] Home Phone: [phone] Relation: Daughter  Code Status:  DNR Goals of care: Advanced Directive information    01/31/2023    9:54 AM  Advanced Directives  Does Patient Have a Medical Advance Directive? Yes  Type of Advance Directive Out of facility DNR (pink MOST or yellow form)  Does patient want to make changes to medical advance directive? No - Patient declined     Chief Complaint  Patient presents with   Medical Management of Chronic Issues    Medical Management of Chronic Issues.     HPI:  Pt is a 89 y.o. female seen today for medical management of chronic diseases.  Patient is lying in bed.  She says some nonsensical unintelligible phrases under her breath.  Nursing without acute concerns at this time.   Past Medical History:  Diagnosis Date   Delirium 04/27/2020   Diabetes mellitus without complication (HCC)    Hypercholesterolemia    Hypertension    Stage 3 chronic kidney disease due to type 2 diabetes mellitus [facility])    Past Surgical History:  Procedure Laterality Date   COLONOSCOPY N/A 09/24/2021   Procedure: COLONOSCOPY/BOWEL DECOMPRESSION;  Surgeon: Midge Minium, MD;  Location: [facility] ENDOSCOPY;  Service: Endoscopy;  Laterality: N/A;    No Known Allergies  Outpatient Encounter Medications as of 01/31/2023  Medication Sig   acetaminophen (TYLENOL) 500 MG tablet Take 1,000 mg by mouth every 8 (eight) hours as needed.   potassium chloride SA  (KLOR-CON M) 20 MEQ tablet Take 20 mEq by mouth daily.   sodium bicarbonate 650 MG tablet Take 650 mg by mouth 2 (two) times daily.   Zinc Oxide (TRIPLE PASTE) 12.8 % ointment Apply 1 Application topically as needed for irritation.   [DISCONTINUED] acetaminophen (TYLENOL) 325 MG tablet Take 2 tablets (650 mg total) by mouth every 6 (six) hours as needed for mild pain (or Fever >/= 101).   [DISCONTINUED] simethicone (MYLICON) 80 MG chewable tablet Chew 80 mg by mouth every 6 (six) hours as needed for flatulence.   No facility-administered encounter medications on file as of 01/31/2023.    Review of Systems  Immunization History  Administered Date(s) Administered   Influenza Inj Mdck Quad Pf 12/14/2017   Influenza,inj,Quad PF,6+ Mos 11/07/2019   Influenza-Unspecified 12/06/2021, 12/13/2022   Moderna SARS-COV2 Booster Vaccination 02/10/2020, 07/09/2020   Moderna Sars-Covid-2 Vaccination 04/19/2019, 05/17/2019, 12/30/2021   PFIZER Comirnaty(Gray Top)Covid-19 Tri-Sucrose Vaccine 12/30/2021   PNEUMOCOCCAL CONJUGATE-20 05/23/2022   Pneumococcal Conjugate-13 11/21/2015   Pneumococcal Polysaccharide-23 04/20/2005   Unspecified SARS-COV-2 Vaccination 11/12/2020, 07/19/2021   Zoster Recombinant(Shingrix) 12/14/2017   Pertinent  Health Maintenance Due  Topic Date Due   DEXA SCAN  Never done   INFLUENZA VACCINE  Completed      09/25/2021   10:00 AM 09/25/2021    9:00 PM 09/26/2021    8:00 AM 09/26/2021    8:36 PM 09/27/2021    9:00 AM  Fall Risk  (RETIRED) Patient Fall Risk Level High fall risk High fall risk High fall  risk High fall risk High fall risk   Functional Status Survey:    Vitals:   01/31/23 0949 01/31/23 0950  BP: (!) 176/84 (!) 146/82  Pulse: 86   Resp: 20   Temp: (!) 97.2 F (36.2 C)   SpO2: 97%   Weight: 133 lb 12.8 oz (60.7 kg)   Height: 5\' 3"  (1.6 m)    Body mass index is 23.7 kg/m. Physical Exam Constitutional:      Comments: Thin, frail, muscle wasting of the  face and shoulders.  Cardiovascular:     Rate and Rhythm: Normal rate and regular rhythm.     Pulses: Normal pulses.  Pulmonary:     Effort: Pulmonary effort is normal.  Musculoskeletal:     Comments: 4+ pitting edema to the hips.  Neurological:     Mental Status: She is alert. She is disoriented.     Labs reviewed: Recent Labs    09/04/22 0000 09/21/22 0000 10/26/22 0000 11/23/22 0000  NA 143 144 143 144  K 3.8 3.5 3.5 3.0*  CL 116* 113* 107 104  CO2 20 25* 28*  --   BUN 40* 40* 25* 22*  CREATININE 1.7* 1.7* 1.5* 1.3*  CALCIUM 8.3* 8.2* 8.3* 8.3*   Recent Labs    08/03/22 0000 09/21/22 0000 11/23/22 0000  AST 13 16 18   ALT 6* 6* 8  ALKPHOS 35 34 46  ALBUMIN 3.1* 3.1* 2.9*   Recent Labs    09/04/22 0000 09/11/22 0000 10/26/22 0000  WBC 4.0 3.8 3.7  NEUTROABS 1,664.00 1,520.00 1,606.00  HGB 8.2* 8.9* 9.1*  HCT 26* 28* 29*  PLT 146* 170 181   Lab Results  Component Value Date   TSH 0.956 04/17/2020   Lab Results  Component Value Date   HGBA1C 5.2 06/21/2022   Lab Results  Component Value Date   CHOL 147 12/22/2021   HDL 40 12/22/2021   LDLCALC 89 12/22/2021   TRIG 88 12/22/2021    Significant Diagnostic Results in last 30 days:  No results found.  Assessment/Plan CKD (chronic kidney disease), stage IV (HCC)  Essential hypertension  Protein-calorie malnutrition, severe  Moderate dementia, unspecified dementia type, unspecified whether behavioral, psychotic, or mood disturbance or anxiety (HCC)  Leg edema Patient with advanced CKD now with volume overload and significant swelling to the lower abdomen.  No respiratory distress at this time vital signs remained stable with hypertension.  Comfort based medications and treatment at this time.  Patient is currently on hospice.  Continue supportive care.  Poor p.o. intake and significant weight gain despite muscle wasting.  Weight gain likely due to fluid volume.  Continue protein supplementation as  tolerated.  Given poor kidney function no longer evaluating labs, will discontinue potassium supplementation at this time.  No longer on blood pressure medicine which was contributing to hypokalemia. Family/ staff Communication: Nursing  Labs/tests ordered: None

## 2023-03-06 ENCOUNTER — Non-Acute Institutional Stay (SKILLED_NURSING_FACILITY): Payer: Medicare Other | Admitting: Nurse Practitioner

## 2023-03-06 ENCOUNTER — Encounter: Payer: Self-pay | Admitting: Nurse Practitioner

## 2023-03-06 DIAGNOSIS — L84 Corns and callosities: Secondary | ICD-10-CM | POA: Diagnosis not present

## 2023-03-06 NOTE — Progress Notes (Signed)
 Location:  Other [facility].  Nursing Home Room Number: [facility] 302A Place of Service:  SNF [phone]) Harlene An, NP  PCP: Abdul Fine, MD  Patient Care Team: Abdul Fine, MD as PCP - General [facility] Medicine)  Extended Emergency Contact Information Primary Emergency Contact: Coble,Teresa Tapp Address: [address]          [place], [place] [address] [place] Work Phone: [phone] Mobile Phone: [phone] Relation: Daughter Secondary Emergency Contact: Parker,Penny  [place] Home Phone: [phone] Relation: Daughter  Goals of care: Advanced Directive information    03/06/2023   12:50 PM  Advanced Directives  Does Patient Have a Medical Advance Directive? Yes  Type of Advance Directive Out of facility DNR (pink MOST or yellow form)  Does patient want to make changes to medical advance directive? No - Patient declined     Chief Complaint  Patient presents with   Acute Visit    Spot on foot    HPI:  Pt is a 89 y.o. female seen today for Spot on left Foot.  Evening nursing staff noted a place on the inside of pts left foot. Pt reports area is not new but has gotten bigger Denies any pain No injury She stays in the bed out of choice    Past Medical History:  Diagnosis Date   Delirium 04/27/2020   Diabetes mellitus without complication (HCC)    Hypercholesterolemia    Hypertension    Stage 3 chronic kidney disease due to type 2 diabetes mellitus [facility])    Past Surgical History:  Procedure Laterality Date   COLONOSCOPY N/A 09/24/2021   Procedure: COLONOSCOPY/BOWEL DECOMPRESSION;  Surgeon: Jinny Carmine, MD;  Location: [facility] ENDOSCOPY;  Service: Endoscopy;  Laterality: N/A;    No Known Allergies  Outpatient Encounter Medications as of 03/06/2023  Medication Sig   acetaminophen  (TYLENOL ) 325 MG tablet Take 650 mg by mouth 3 (three) times daily.   acetaminophen  (TYLENOL ) 500 MG tablet Take 1,000 mg by mouth every  8 (eight) hours as needed.   diclofenac  Sodium (VOLTAREN ) 1 % GEL Apply to bilateral knees topically every 8 hours as needed and every day and evening shift.   sodium bicarbonate 650 MG tablet Take 650 mg by mouth 2 (two) times daily.   Zinc  Oxide (TRIPLE PASTE) 12.8 % ointment Apply 1 Application topically as needed for irritation.   No facility-administered encounter medications on file as of 03/06/2023.    Review of Systems  Skin:  Positive for color change. Negative for pallor, rash and wound.     Immunization History  Administered Date(s) Administered   Influenza Inj Mdck Quad Pf 12/14/2017   Influenza,inj,Quad PF,6+ Mos 11/07/2019   Influenza-Unspecified 12/06/2021, 12/13/2022   Moderna SARS-COV2 Booster Vaccination 02/10/2020, 07/09/2020   Moderna Sars-Covid-2 Vaccination 04/19/2019, 05/17/2019, 12/30/2021, 11/17/2022   PFIZER Comirnaty(Gray Top)Covid-19 Tri-Sucrose Vaccine 12/30/2021   PNEUMOCOCCAL CONJUGATE-20 05/23/2022   Pneumococcal Conjugate-13 11/21/2015   Pneumococcal Polysaccharide-23 04/20/2005   Unspecified SARS-COV-2 Vaccination 11/12/2020, 07/19/2021   Zoster Recombinant(Shingrix) 12/14/2017   Pertinent  Health Maintenance Due  Topic Date Due   FOOT EXAM  Never done   OPHTHALMOLOGY EXAM  Never done   DEXA SCAN  Never done   HEMOGLOBIN A1C  12/22/2022   INFLUENZA VACCINE  Completed      09/25/2021   10:00 AM 09/25/2021    9:00 PM 09/26/2021    8:00 AM 09/26/2021    8:36 PM 09/27/2021    9:00 AM  Fall  Risk  (RETIRED) Patient Fall Risk Level High fall risk High fall risk High fall risk High fall risk High fall risk   Functional Status Survey:    Vitals:   03/06/23 1242  Pulse: 70  Temp: 98.4 F (36.9 C)  Weight: 133 lb 12.8 oz (60.7 kg)  Height: 5' 3 (1.6 m)   Body mass index is 23.7 kg/m. Physical Exam Constitutional:      Appearance: Normal appearance.  Pulmonary:     Effort: Pulmonary effort is normal.  Neurological:     Mental Status: She is  alert. Mental status is at baseline.  Psychiatric:        Mood and Affect: Mood normal.     Labs reviewed: Recent Labs    09/04/22 0000 09/21/22 0000 10/26/22 0000 11/23/22 0000  NA 143 144 143 144  K 3.8 3.5 3.5 3.0*  CL 116* 113* 107 104  CO2 20 25* 28*  --   BUN 40* 40* 25* 22*  CREATININE 1.7* 1.7* 1.5* 1.3*  CALCIUM 8.3* 8.2* 8.3* 8.3*   Recent Labs    08/03/22 0000 09/21/22 0000 11/23/22 0000  AST 13 16 18   ALT 6* 6* 8  ALKPHOS 35 34 46  ALBUMIN 3.1* 3.1* 2.9*   Recent Labs    09/04/22 0000 09/11/22 0000 10/26/22 0000  WBC 4.0 3.8 3.7  NEUTROABS 1,664.00 1,520.00 1,606.00  HGB 8.2* 8.9* 9.1*  HCT 26* 28* 29*  PLT 146* 170 181   Lab Results  Component Value Date   TSH 0.956 04/17/2020   Lab Results  Component Value Date   HGBA1C 5.2 06/21/2022   Lab Results  Component Value Date   CHOL 147 12/22/2021   HDL 40 12/22/2021   LDLCALC 89 12/22/2021   TRIG 88 12/22/2021    Significant Diagnostic Results in last 30 days:  No results found.  Assessment/Plan 1. Callus of foot (Primary) -will have staff apply skin prep daily -to monitor for further redness/skin breakdown  Eligio Angert K. Caro BODILY [facility] [phone]

## 2023-03-19 LAB — BASIC METABOLIC PANEL
BUN: 34 — AB (ref 4–21)
CO2: 41 — AB (ref 13–22)
Chloride: 92 — AB (ref 99–108)
Creatinine: 1.7 — AB (ref 0.5–1.1)
Glucose: 62
Potassium: 2 meq/L — AB (ref 3.5–5.1)
Sodium: 140 (ref 137–147)

## 2023-03-19 LAB — COMPREHENSIVE METABOLIC PANEL
Calcium: 7.8 — AB (ref 8.7–10.7)
eGFR: 27

## 2023-04-19 ENCOUNTER — Non-Acute Institutional Stay (SKILLED_NURSING_FACILITY): Payer: Self-pay | Admitting: Nurse Practitioner

## 2023-04-19 ENCOUNTER — Encounter: Payer: Self-pay | Admitting: Nurse Practitioner

## 2023-04-19 DIAGNOSIS — E1122 Type 2 diabetes mellitus with diabetic chronic kidney disease: Secondary | ICD-10-CM | POA: Diagnosis not present

## 2023-04-19 DIAGNOSIS — E876 Hypokalemia: Secondary | ICD-10-CM | POA: Diagnosis not present

## 2023-04-19 DIAGNOSIS — K5981 Ogilvie syndrome: Secondary | ICD-10-CM

## 2023-04-19 DIAGNOSIS — I10 Essential (primary) hypertension: Secondary | ICD-10-CM | POA: Diagnosis not present

## 2023-04-19 DIAGNOSIS — N184 Chronic kidney disease, stage 4 (severe): Secondary | ICD-10-CM

## 2023-04-19 DIAGNOSIS — F03B0 Unspecified dementia, moderate, without behavioral disturbance, psychotic disturbance, mood disturbance, and anxiety: Secondary | ICD-10-CM | POA: Diagnosis not present

## 2023-04-19 NOTE — Assessment & Plan Note (Signed)
Continues on supplement 

## 2023-04-19 NOTE — Assessment & Plan Note (Signed)
 Stable, no acute changes in cognitive or functional status, continue supportive care.

## 2023-04-19 NOTE — Assessment & Plan Note (Signed)
 No longer on medication, blood pressure stable at this time

## 2023-04-19 NOTE — Assessment & Plan Note (Signed)
 Diet controlled. Last A1c well controlled.

## 2023-04-19 NOTE — Assessment & Plan Note (Signed)
 Comfort measures only, with supportive supplements. No longer monitoring renal function

## 2023-04-19 NOTE — Progress Notes (Signed)
 Location:  Other [facility].  Nursing Home Room Number: [facility] 302A Place of Service:  SNF [phone]) [name], NP  PCP: Earnestine Mealing, MD  Patient Care Team: Earnestine Mealing, MD as PCP - General [facility] Medicine)  Extended Emergency Contact Information Primary Emergency Contact: [name] Address: [address]          [place], [place] [address] [place] Work Phone: [phone] Mobile Phone: [phone] Relation: Daughter Secondary Emergency Contact: [name] Home Phone: [phone] Relation: Daughter  Goals of care: Advanced Directive information    03/06/2023   12:50 PM  Advanced Directives  Does Patient Have a Medical Advance Directive? Yes  Type of Advance Directive Out of facility DNR (pink MOST or yellow form)  Does patient want to make changes to medical advance directive? No - Patient declined     Chief Complaint  Patient presents with   Medical Management of Chronic Issues    Medical Management of Chronic Issues.     HPI:  Pt is a 89 y.o. female seen today for medical management of chronic disease.  Pt is on hospice services and comfort measures only She eats breakfast and supper.  Declines weight checks. Does not wish to have routine lab work She continues on supplements for potassium and sodium  Denies any pain at this time.    Past Medical History:  Diagnosis Date   Delirium 04/27/2020   Diabetes mellitus without complication (HCC)    Hypercholesterolemia    Hypertension    Stage 3 chronic kidney disease due to type 2 diabetes mellitus [facility])    Past Surgical History:  Procedure Laterality Date   COLONOSCOPY N/A 09/24/2021   Procedure: COLONOSCOPY/BOWEL DECOMPRESSION;  Surgeon: Midge Minium, MD;  Location: [facility] ENDOSCOPY;  Service: Endoscopy;  Laterality: N/A;    No Known Allergies  Outpatient Encounter Medications as of 04/19/2023  Medication Sig   acetaminophen (TYLENOL)  325 MG tablet Take 650 mg by mouth 3 (three) times daily.   acetaminophen (TYLENOL) 500 MG tablet Take 1,000 mg by mouth daily. As needed.   diclofenac Sodium (VOLTAREN) 1 % GEL Apply 4 g topically every 12 (twelve) hours as needed. To bilateral knees. Apply to Bilateral knees every day and evening shift.   potassium chloride (KLOR-CON) 20 MEQ packet Take 40 mEq by mouth daily.   sodium bicarbonate 650 MG tablet Take 650 mg by mouth 2 (two) times daily.   Zinc Oxide (TRIPLE PASTE) 12.8 % ointment Apply 1 Application topically as needed for irritation.   No facility-administered encounter medications on file as of 04/19/2023.    Review of Systems  Constitutional:  Negative for activity change, appetite change, fatigue and unexpected weight change.  HENT:  Negative for congestion and hearing loss.   Eyes: Negative.   Respiratory:  Negative for cough and shortness of breath.   Cardiovascular:  Negative for chest pain, palpitations and leg swelling.  Gastrointestinal:  Negative for abdominal pain, constipation and diarrhea.  Genitourinary:  Negative for difficulty urinating and dysuria.  Musculoskeletal:  Negative for arthralgias and myalgias.  Skin:  Negative for color change and wound.  Neurological:  Positive for weakness. Negative for dizziness.  Psychiatric/Behavioral:  Negative for agitation, behavioral problems and confusion.      Immunization History  Administered Date(s) Administered   Influenza Inj Mdck Quad Pf 12/14/2017   Influenza,inj,Quad PF,6+ Mos 11/07/2019   Influenza-Unspecified 12/06/2021, 12/13/2022   Moderna SARS-COV2 Booster Vaccination 02/10/2020, 07/09/2020  Moderna Sars-Covid-2 Vaccination 04/19/2019, 05/17/2019, 12/30/2021, 11/17/2022   PFIZER Comirnaty(Gray Top)Covid-19 Tri-Sucrose Vaccine 12/30/2021   PNEUMOCOCCAL CONJUGATE-20 05/23/2022   Pneumococcal Conjugate-13 11/21/2015   Pneumococcal Polysaccharide-23 04/20/2005   Unspecified SARS-COV-2 Vaccination  11/12/2020, 07/19/2021   Zoster Recombinant(Shingrix) 12/14/2017   Pertinent  Health Maintenance Due  Topic Date Due   FOOT EXAM  Never done   OPHTHALMOLOGY EXAM  Never done   DEXA SCAN  Never done   HEMOGLOBIN A1C  12/22/2022   INFLUENZA VACCINE  Completed      09/25/2021   10:00 AM 09/25/2021    9:00 PM 09/26/2021    8:00 AM 09/26/2021    8:36 PM 09/27/2021    9:00 AM  Fall Risk  (RETIRED) Patient Fall Risk Level High fall risk High fall risk High fall risk High fall risk High fall risk   Functional Status Survey:    Vitals:   04/19/23 0926 04/19/23 0934  BP: (!) 180/92 136/74  Pulse: 72   Resp: 19   Temp: 97.6 F (36.4 C)   SpO2: 93%   Weight: 139 lb 9.6 oz (63.3 kg)   Height: 5\' 3"  (1.6 m)    Body mass index is 24.73 kg/m. Physical Exam Constitutional:      General: She is not in acute distress.    Appearance: She is well-developed. She is not diaphoretic.  HENT:     Head: Normocephalic and atraumatic.     Mouth/Throat:     Pharynx: No oropharyngeal exudate.  Eyes:     Conjunctiva/sclera: Conjunctivae normal.     Pupils: Pupils are equal, round, and reactive to light.  Cardiovascular:     Rate and Rhythm: Normal rate and regular rhythm.     Heart sounds: Normal heart sounds.  Pulmonary:     Effort: Pulmonary effort is normal.     Breath sounds: Normal breath sounds.  Abdominal:     General: Bowel sounds are normal. There is distension.     Palpations: Abdomen is soft.     Tenderness: There is no abdominal tenderness.  Musculoskeletal:     Cervical back: Normal range of motion and neck supple.     Right lower leg: Edema (3+ throughout leg) present.     Left lower leg: Edema (3+ throughout leg) present.  Skin:    General: Skin is warm and dry.  Neurological:     Mental Status: She is alert. Mental status is at baseline.     Motor: Weakness present.     Gait: Gait abnormal.  Psychiatric:        Mood and Affect: Mood normal.     Labs reviewed: Recent  Labs    09/21/22 0000 10/26/22 0000 11/23/22 0000 03/19/23 0000  NA 144 143 144 140  K 3.5 3.5 3.0* 2.0*  CL 113* 107 104 92*  CO2 25* 28*  --  41*  BUN 40* 25* 22* 34*  CREATININE 1.7* 1.5* 1.3* 1.7*  CALCIUM 8.2* 8.3* 8.3* 7.8*   Recent Labs    08/03/22 0000 09/21/22 0000 11/23/22 0000  AST 13 16 18   ALT 6* 6* 8  ALKPHOS 35 34 46  ALBUMIN 3.1* 3.1* 2.9*   Recent Labs    09/04/22 0000 09/11/22 0000 10/26/22 0000  WBC 4.0 3.8 3.7  NEUTROABS 1,664.00 1,520.00 1,606.00  HGB 8.2* 8.9* 9.1*  HCT 26* 28* 29*  PLT 146* 170 181   Lab Results  Component Value Date   TSH 0.956 04/17/2020   Lab Results  Component Value Date   HGBA1C 5.2 06/21/2022   Lab Results  Component Value Date   CHOL 147 12/22/2021   HDL 40 12/22/2021   LDLCALC 89 12/22/2021   TRIG 88 12/22/2021    Significant Diagnostic Results in last 30 days:  No results found.  Assessment/Plan CKD (chronic kidney disease), stage IV (HCC) Comfort measures only, with supportive supplements. No longer monitoring renal function   Essential hypertension No longer on medication, blood pressure stable at this time   Type 2 diabetes mellitus with stage 4 chronic kidney disease (HCC) Diet controlled. Last A1c well controlled.   Moderate dementia, unspecified dementia type, unspecified whether behavioral, psychotic, or mood disturbance or anxiety (HCC) Stable, no acute changes in cognitive or functional status, continue supportive care.   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      Janene Harvey. [facility]ag[facility] & Adult Medicine [phone]

## 2023-04-19 NOTE — Assessment & Plan Note (Signed)
 Stable, continues to have regular BMS without abdominal pain

## 2023-05-25 ENCOUNTER — Non-Acute Institutional Stay (SKILLED_NURSING_FACILITY): Payer: Self-pay | Admitting: Student

## 2023-05-25 ENCOUNTER — Encounter: Payer: Self-pay | Admitting: Student

## 2023-05-25 DIAGNOSIS — Z515 Encounter for palliative care: Secondary | ICD-10-CM

## 2023-05-25 DIAGNOSIS — F03B0 Unspecified dementia, moderate, without behavioral disturbance, psychotic disturbance, mood disturbance, and anxiety: Secondary | ICD-10-CM

## 2023-05-25 DIAGNOSIS — N184 Chronic kidney disease, stage 4 (severe): Secondary | ICD-10-CM

## 2023-05-25 DIAGNOSIS — T148XXA Other injury of unspecified body region, initial encounter: Secondary | ICD-10-CM

## 2023-05-25 DIAGNOSIS — R609 Edema, unspecified: Secondary | ICD-10-CM

## 2023-05-25 DIAGNOSIS — E1122 Type 2 diabetes mellitus with diabetic chronic kidney disease: Secondary | ICD-10-CM | POA: Diagnosis not present

## 2023-05-25 NOTE — Progress Notes (Unsigned)
 Location:  Other [facility].  Nursing Home Room Number: [facility] 302A Place of Service:  SNF [phone]) Provider:  Earnestine Mealing, MD  Patient Care Team: Earnestine Mealing, MD as PCP - General [facility] Medicine)  Extended Emergency Contact Information Primary Emergency Contact: Coble,Teresa Tapp Address: [address]          [place], [place] [address] [place] Work Phone: [phone] Mobile Phone: [phone] Relation: Daughter Secondary Emergency Contact: [name] Home Phone: [phone] Relation: Daughter  Code Status:  DNR Goals of care: Advanced Directive information    05/25/2023    9:38 AM  Advanced Directives  Does Patient Have a Medical Advance Directive? Yes  Type of Advance Directive Out of facility DNR (pink MOST or yellow form)  Does patient want to make changes to medical advance directive? No - Patient declined     Chief Complaint  Patient presents with  . Medical Management of Chronic Issues    Medical Management of Chronic Issues.     HPI:  Pt is a 89 y.o. female seen today for medical management of chronic diseases.    History of Present Illness The patient is a 89 year old with stage four cancer who presents with a spot on her ankle.  She has a spot on her ankle that has been present since childhood. Over time, the spot has changed in appearance, initially matching the color of her feet but now appearing different. There is no pain associated with the spot.  She is currently on hospice care for stage four cancer, focusing on comfort and quality of life rather than curative treatment. There are no specific symptoms related to her cancer mentioned in this conversation.  She feels good overall and is eating well, having had eggs and cheese for breakfast. She is aware of the current date and year, indicating intact cognitive function.  Past Medical History - History of stage 4 cancer (currently on  hospice)  Family History - Sister's age mentioned, but no specific medical history provided  Physical Exam EXTREMITIES: Lesion on ankle with changes in color and texture.  Assessment and Plan Cognitive impairment Mild cognitive impairment, evidenced by confusion about the current year, with intact orientation to month and general awareness. No acute distress or significant cognitive decline.  Ankle lesion Chronic ankle lesion present since childhood, with recent changes in color. No associated pain. Further evaluation needed to rule out malignant transformation.  End-of-life care Currently on hospice care, focusing on comfort and quality of life rather than curative treatment, consistent with her age and overall health status. Past Medical History:  Diagnosis Date  . Delirium 04/27/2020  . Diabetes mellitus without complication (HCC)   . Hypercholesterolemia   . Hypertension   . 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 COLONOSCOPY N/A 09/24/2021   Procedure: COLONOSCOPY/BOWEL DECOMPRESSION;  Surgeon: Midge Minium, MD;  Location: [facility] ENDOSCOPY;  Service: Endoscopy;  Laterality: N/A;    No Known Allergies  Outpatient Encounter Medications as of 05/25/2023  Medication Sig  . acetaminophen (TYLENOL) 325 MG tablet Take 650 mg by mouth 3 (three) times daily.  Marland Kitchen acetaminophen (TYLENOL) 500 MG tablet Take 1,000 mg by mouth daily. As needed.  . diclofenac Sodium (VOLTAREN) 1 % GEL Apply 4 g topically every 12 (twelve) hours as needed. To bilateral knees. Apply to Bilateral knees every day and evening shift.  Marland Kitchen  potassium chloride (KLOR-CON) 20 MEQ packet Take 40 mEq by mouth daily.  . sodium bicarbonate 650 MG tablet Take 650 mg by mouth 2 (two) times daily.  . Zinc Oxide (TRIPLE PASTE) 12.8 % ointment Apply 1 Application topically as needed for irritation.   No facility-administered encounter medications  on file as of 05/25/2023.    Review of Systems  Immunization History  Administered Date(s) Administered  . Influenza Inj Mdck Quad Pf 12/14/2017  . Influenza,inj,Quad PF,6+ Mos 11/07/2019  . Influenza-Unspecified 12/06/2021, 12/13/2022  . Moderna SARS-COV2 Booster Vaccination 02/10/2020, 07/09/2020  . Moderna Sars-Covid-2 Vaccination 04/19/2019, 05/17/2019, 12/30/2021, 11/17/2022  . PFIZER Comirnaty(Gray Top)Covid-19 Tri-Sucrose Vaccine 12/30/2021  . PNEUMOCOCCAL CONJUGATE-20 05/23/2022  . Pneumococcal Conjugate-13 11/21/2015  . Pneumococcal Polysaccharide-23 04/20/2005  . Unspecified SARS-COV-2 Vaccination 11/12/2020, 07/19/2021  . Zoster Recombinant(Shingrix) 12/14/2017   Pertinent  Health Maintenance Due  Topic Date Due  . FOOT EXAM  Never done  . OPHTHALMOLOGY EXAM  Never done  . DEXA SCAN  Never done  . HEMOGLOBIN A1C  12/22/2022  . INFLUENZA VACCINE  09/21/2023      09/25/2021   10:00 AM 09/25/2021    9:00 PM 09/26/2021    8:00 AM 09/26/2021    8:36 PM 09/27/2021    9:00 AM  Fall Risk  (RETIRED) Patient Fall Risk Level High fall risk High fall risk High fall risk High fall risk High fall risk   Functional Status Survey:    Vitals:   05/25/23 0933 05/25/23 0939  BP: (!) 174/82 (!) 167/76  Pulse: 76   Resp: 16   Temp: 98.1 F (36.7 C)   SpO2: 97%   Weight: 139 lb 9.6 oz (63.3 kg)   Height: 5\' 3"  (1.6 m)    Body mass index is 24.73 kg/m. Physical Exam  Labs reviewed: Recent Labs    09/21/22 0000 10/26/22 0000 11/23/22 0000 03/19/23 0000  NA 144 143 144 140  K 3.5 3.5 3.0* 2.0*  CL 113* 107 104 92*  CO2 25* 28*  --  41*  BUN 40* 25* 22* 34*  CREATININE 1.7* 1.5* 1.3* 1.7*  CALCIUM 8.2* 8.3* 8.3* 7.8*   Recent Labs    08/03/22 0000 09/21/22 0000 11/23/22 0000  AST 13 16 18   ALT 6* 6* 8  ALKPHOS 35 34 46  ALBUMIN 3.1* 3.1* 2.9*   Recent Labs    09/04/22 0000 09/11/22 0000 10/26/22 0000  WBC 4.0 3.8 3.7  NEUTROABS 1,664.00 1,520.00 1,606.00   HGB 8.2* 8.9* 9.1*  HCT 26* 28* 29*  PLT 146* 170 181   Lab Results  Component Value Date   TSH 0.956 04/17/2020   Lab Results  Component Value Date   HGBA1C 5.2 06/21/2022   Lab Results  Component Value Date   CHOL 147 12/22/2021   HDL 40 12/22/2021   LDLCALC 89 12/22/2021   TRIG 88 12/22/2021    Significant Diagnostic Results in last 30 days:  No results found.  Assessment/Plan There are no diagnoses linked to this encounter.   Family/ staff Communication: ***  Labs/tests ordered:  ***

## 2023-05-31 ENCOUNTER — Encounter: Payer: Self-pay | Admitting: Student

## 2023-06-13 ENCOUNTER — Other Ambulatory Visit: Payer: Self-pay | Admitting: Student

## 2023-06-13 DIAGNOSIS — Z515 Encounter for palliative care: Secondary | ICD-10-CM | POA: Insufficient documentation

## 2023-06-13 MED ORDER — LORAZEPAM 0.5 MG PO TABS: 0.5000 mg | ORAL_TABLET | Freq: Three times a day (TID) | ORAL | 0 refills | Status: DC | PRN

## 2023-06-13 MED ORDER — MORPHINE SULFATE (CONCENTRATE) 20 MG/ML PO SOLN: 5.0000 mg | ORAL | 0 refills | Status: DC | PRN

## 2023-06-13 NOTE — Progress Notes (Signed)
 Patient declining with hospice, comfort medications ordered.

## 2023-07-13 ENCOUNTER — Telehealth: Admitting: Student

## 2023-07-13 NOTE — Telephone Encounter (Signed)
 Received call that patient has urinary retention. Order for foley catheter >500 ML. Patient is on hospice.

## 2023-08-02 ENCOUNTER — Other Ambulatory Visit: Payer: Self-pay | Admitting: Nurse Practitioner

## 2023-08-02 DIAGNOSIS — Z515 Encounter for palliative care: Secondary | ICD-10-CM

## 2023-08-02 MED ORDER — MORPHINE SULFATE (CONCENTRATE) 20 MG/ML PO SOLN: 5.0000 mg | ORAL | 0 refills | Status: DC | PRN

## 2023-08-05 ENCOUNTER — Other Ambulatory Visit: Payer: Self-pay | Admitting: Internal Medicine

## 2023-08-05 DIAGNOSIS — Z515 Encounter for palliative care: Secondary | ICD-10-CM

## 2023-08-05 MED ORDER — LORAZEPAM 0.5 MG PO TABS: 0.5000 mg | ORAL_TABLET | ORAL | 0 refills | Status: DC | PRN

## 2023-08-21 NOTE — Progress Notes (Signed)
 Patient is End of life. Ativan  called to help with Anxiety

## 2023-08-21 DEATH — deceased
